# Patient Record
Sex: Female | Born: 1940 | Race: White | Hispanic: No | Smoking: Never smoker
Health system: Southern US, Community
[De-identification: ages and names within clinical notes are randomized; demographics above are authoritative.]

## PROBLEM LIST (undated history)

## (undated) DIAGNOSIS — K648 Other hemorrhoids: Secondary | ICD-10-CM

## (undated) DIAGNOSIS — E785 Hyperlipidemia, unspecified: Secondary | ICD-10-CM

## (undated) DIAGNOSIS — K219 Gastro-esophageal reflux disease without esophagitis: Secondary | ICD-10-CM

## (undated) DIAGNOSIS — I4891 Unspecified atrial fibrillation: Secondary | ICD-10-CM

## (undated) DIAGNOSIS — D649 Anemia, unspecified: Secondary | ICD-10-CM

## (undated) DIAGNOSIS — J45909 Unspecified asthma, uncomplicated: Secondary | ICD-10-CM

## (undated) DIAGNOSIS — H269 Unspecified cataract: Secondary | ICD-10-CM

## (undated) DIAGNOSIS — M8580 Other specified disorders of bone density and structure, unspecified site: Secondary | ICD-10-CM

## (undated) DIAGNOSIS — N842 Polyp of vagina: Secondary | ICD-10-CM

## (undated) DIAGNOSIS — K5790 Diverticulosis of intestine, part unspecified, without perforation or abscess without bleeding: Secondary | ICD-10-CM

## (undated) DIAGNOSIS — E559 Vitamin D deficiency, unspecified: Secondary | ICD-10-CM

## (undated) DIAGNOSIS — E039 Hypothyroidism, unspecified: Secondary | ICD-10-CM

## (undated) HISTORY — DX: Anemia, unspecified: D64.9

## (undated) HISTORY — DX: Unspecified atrial fibrillation: I48.91

## (undated) HISTORY — DX: Vitamin D deficiency, unspecified: E55.9

## (undated) HISTORY — DX: Other specified disorders of bone density and structure, unspecified site: M85.80

## (undated) HISTORY — DX: Hypothyroidism, unspecified: E03.9

## (undated) HISTORY — DX: Hyperlipidemia, unspecified: E78.5

## (undated) HISTORY — DX: Unspecified cataract: H26.9

## (undated) HISTORY — DX: Unspecified asthma, uncomplicated: J45.909

## (undated) HISTORY — PX: CATARACT EXTRACTION, BILATERAL: SHX1313

## (undated) HISTORY — DX: Other hemorrhoids: K64.8

## (undated) HISTORY — PX: BREAST EXCISIONAL BIOPSY: SUR124

## (undated) HISTORY — PX: TUBAL LIGATION: SHX77

## (undated) HISTORY — PX: OTHER SURGICAL HISTORY: SHX169

## (undated) HISTORY — DX: Polyp of vagina: N84.2

## (undated) HISTORY — PX: TONSILLECTOMY AND ADENOIDECTOMY: SUR1326

## (undated) HISTORY — DX: Gastro-esophageal reflux disease without esophagitis: K21.9

## (undated) HISTORY — DX: Diverticulosis of intestine, part unspecified, without perforation or abscess without bleeding: K57.90

---

## 1977-05-05 HISTORY — PX: LAPAROSCOPY: SHX197

## 1980-03-20 HISTORY — PX: DILATION AND CURETTAGE OF UTERUS: SHX78

## 1981-05-28 HISTORY — PX: ABDOMINAL HYSTERECTOMY: SHX81

## 2000-03-06 ENCOUNTER — Other Ambulatory Visit: Admission: RE | Admit: 2000-03-06 | Discharge: 2000-03-06 | Payer: Self-pay

## 2003-01-23 HISTORY — PX: KNEE ARTHROSCOPY: SUR90

## 2004-12-26 ENCOUNTER — Ambulatory Visit (HOSPITAL_COMMUNITY): Admission: RE | Admit: 2004-12-26 | Discharge: 2004-12-26 | Payer: Self-pay | Admitting: Sports Medicine

## 2005-12-04 ENCOUNTER — Ambulatory Visit (HOSPITAL_BASED_OUTPATIENT_CLINIC_OR_DEPARTMENT_OTHER): Admission: RE | Admit: 2005-12-04 | Discharge: 2005-12-04 | Payer: Self-pay | Admitting: Surgery

## 2005-12-04 ENCOUNTER — Encounter (INDEPENDENT_AMBULATORY_CARE_PROVIDER_SITE_OTHER): Payer: Self-pay | Admitting: *Deleted

## 2008-03-16 ENCOUNTER — Telehealth: Payer: Self-pay | Admitting: Internal Medicine

## 2008-03-16 ENCOUNTER — Encounter: Payer: Self-pay | Admitting: Internal Medicine

## 2008-03-17 DIAGNOSIS — K5730 Diverticulosis of large intestine without perforation or abscess without bleeding: Secondary | ICD-10-CM | POA: Insufficient documentation

## 2008-03-17 DIAGNOSIS — R141 Gas pain: Secondary | ICD-10-CM

## 2008-03-17 DIAGNOSIS — K625 Hemorrhage of anus and rectum: Secondary | ICD-10-CM

## 2008-03-17 DIAGNOSIS — R143 Flatulence: Secondary | ICD-10-CM

## 2008-03-17 DIAGNOSIS — R142 Eructation: Secondary | ICD-10-CM

## 2008-03-17 DIAGNOSIS — R12 Heartburn: Secondary | ICD-10-CM

## 2008-03-20 ENCOUNTER — Telehealth: Payer: Self-pay | Admitting: Internal Medicine

## 2008-03-20 ENCOUNTER — Ambulatory Visit: Payer: Self-pay | Admitting: Internal Medicine

## 2008-04-03 ENCOUNTER — Telehealth: Payer: Self-pay | Admitting: Internal Medicine

## 2010-09-20 NOTE — Op Note (Signed)
NAMESHALLYN, CONSTANCIO                ACCOUNT NO.:  [id number]   MEDICAL RECORD NO.:  [id number]          PATIENT TYPE:  AMB   LOCATION:  DSC                          FACILITY:  [facility]   PHYSICIAN:  Thomas A. Cornett, M.D.DATE OF BIRTH:  [birth year]   DATE OF PROCEDURE:  DATE OF DISCHARGE:                                 OPERATIVE REPORT   PREOPERATIVE DIAGNOSIS:  Left breast mass x2.   POSTOPERATIVE DIAGNOSIS:  Left breast mass x2.   PROCEDURE:  Open left breast biopsy x2.   SURGEON:  Dr. Harriette Bouillon.   ANESTHESIA:  MAC with 30 mL of 0.25% Marcaine with epinephrine.   ESTIMATED BLOOD LOSS:  5 mL.   SPECIMENS:  1.  Medial left breast mass to Pathology.  2.  Lateral left breast mass to Pathology.   INDICATIONS FOR PROCEDURE:  The patient is a 70 year old female who has had  a normal mammogram but continues to have some nodularity in2 prominent areas  in her left breast that her bothering her.  These were full and she did not  want to observe these areas and wished to have them removed for further  diagnosis.  After discussion of options of observation with close followup  versus open biopsy versus core biopsy, she elected to undergo core biopsy  since this would not be something very easy to biopsy percutaneously and  there no abnormalities on mammogram or ultrasound to help [place].  She  understood the above and agreed to proceed.   DESCRIPTION OF PROCEDURE:  The patient was brought to the operating room and  placed supine.  After induction of MAC anesthesia, the left breast was  prepped and draped in sterile fashion.  The 2 areas of concern were in the  left breast at about the 9 and 10 o'clock position about 2  fingerbreadths  from the edge of the areola and the second area was lateral at about 2  o'clock and it was about 2 fingerbreadths from the areola.  The superior  portion of her nipple was anesthetized after sterile prep and drape with  local anesthesia.  I made one  curvilinear incision since both these areas  could be reached through the one incision on the superior aspect of the  junction of her nipple and skin.  I was able to palpate the medial mass,  dissect around it and pass it off the field.  There was about a centimeter  of tissue or so with some fibrous changes to it.  The second area was  identified in the left lateral breast and again I was able to palpate it,  grab it with an Allis clamp and dissect around it and remove it.  Again  there was some fibrous fatty tissue with it as well.  I did not see any hard  mass though.  Hemostasis was excellent.  The wound was irrigated  and then closed in layers with 3-0 Vicryl and 4-0 Monocryl subcuticular  stitch.  Steri-Strips and dry dressings were applied.  All final counts of  sponges, needles and instruments were found be correct  for this portion of  the case.  The patient was awakened, taken to recovery in satisfactory  condition.      Thomas A. Cornett, M.D.  Electronically Signed     TAC/MEDQ  D:  12/04/2005  T:  12/04/2005  Job:  [id number]   cc:   [facility] PROGRAM

## 2011-04-11 ENCOUNTER — Other Ambulatory Visit: Payer: Self-pay | Admitting: Family Medicine

## 2011-04-11 ENCOUNTER — Other Ambulatory Visit (HOSPITAL_COMMUNITY)
Admission: RE | Admit: 2011-04-11 | Discharge: 2011-04-11 | Disposition: A | Discharge status: Home / Self Care | Payer: Medicare Other | Source: Ambulatory Visit | Attending: Family Medicine | Admitting: Family Medicine

## 2011-04-11 DIAGNOSIS — Z124 Encounter for screening for malignant neoplasm of cervix: Secondary | ICD-10-CM | POA: Insufficient documentation

## 2011-10-09 ENCOUNTER — Other Ambulatory Visit: Payer: Self-pay | Admitting: Dermatology

## 2012-07-14 ENCOUNTER — Encounter: Payer: Self-pay | Admitting: Internal Medicine

## 2012-11-01 ENCOUNTER — Encounter: Payer: Self-pay | Admitting: *Deleted

## 2012-12-21 ENCOUNTER — Ambulatory Visit: Payer: Medicare Other | Admitting: Internal Medicine

## 2013-02-08 ENCOUNTER — Telehealth: Payer: Self-pay | Admitting: *Deleted

## 2013-02-08 ENCOUNTER — Encounter (HOSPITAL_COMMUNITY): Payer: Self-pay | Admitting: Emergency Medicine

## 2013-02-08 ENCOUNTER — Inpatient Hospital Stay (HOSPITAL_COMMUNITY)
Admission: EM | Admit: 2013-02-08 | Discharge: 2013-02-10 | DRG: 310 | Disposition: A | Discharge status: Home / Self Care | Payer: Medicare Other | Attending: Cardiology | Admitting: Cardiology

## 2013-02-08 ENCOUNTER — Emergency Department (HOSPITAL_COMMUNITY): Payer: Medicare Other

## 2013-02-08 ENCOUNTER — Encounter: Payer: Self-pay | Admitting: Internal Medicine

## 2013-02-08 ENCOUNTER — Ambulatory Visit (INDEPENDENT_AMBULATORY_CARE_PROVIDER_SITE_OTHER): Payer: Medicare Other | Admitting: Internal Medicine

## 2013-02-08 VITALS — BP 134/84 | HR 118 | Ht 62.21 in | Wt 198.0 lb

## 2013-02-08 DIAGNOSIS — J45909 Unspecified asthma, uncomplicated: Secondary | ICD-10-CM | POA: Diagnosis present

## 2013-02-08 DIAGNOSIS — K3189 Other diseases of stomach and duodenum: Secondary | ICD-10-CM

## 2013-02-08 DIAGNOSIS — K625 Hemorrhage of anus and rectum: Secondary | ICD-10-CM

## 2013-02-08 DIAGNOSIS — K219 Gastro-esophageal reflux disease without esophagitis: Secondary | ICD-10-CM | POA: Diagnosis present

## 2013-02-08 DIAGNOSIS — D649 Anemia, unspecified: Secondary | ICD-10-CM | POA: Diagnosis present

## 2013-02-08 DIAGNOSIS — K5730 Diverticulosis of large intestine without perforation or abscess without bleeding: Secondary | ICD-10-CM | POA: Diagnosis present

## 2013-02-08 DIAGNOSIS — I4891 Unspecified atrial fibrillation: Principal | ICD-10-CM | POA: Diagnosis present

## 2013-02-08 DIAGNOSIS — E785 Hyperlipidemia, unspecified: Secondary | ICD-10-CM | POA: Diagnosis present

## 2013-02-08 DIAGNOSIS — E039 Hypothyroidism, unspecified: Secondary | ICD-10-CM | POA: Diagnosis present

## 2013-02-08 DIAGNOSIS — K648 Other hemorrhoids: Secondary | ICD-10-CM

## 2013-02-08 LAB — COMPREHENSIVE METABOLIC PANEL
ALT: 56 U/L — ABNORMAL HIGH (ref 0–35)
AST: 63 U/L — ABNORMAL HIGH (ref 0–37)
Albumin: 4.3 g/dL (ref 3.5–5.2)
BUN: 8 mg/dL (ref 6–23)
Calcium: 10 mg/dL (ref 8.4–10.5)
Chloride: 99 mEq/L (ref 96–112)
Creatinine, Ser: 0.64 mg/dL (ref 0.50–1.10)
Potassium: 3.9 mEq/L (ref 3.5–5.1)
Total Protein: 7.7 g/dL (ref 6.0–8.3)

## 2013-02-08 LAB — CBC WITH DIFFERENTIAL/PLATELET
Basophils Absolute: 0 10*3/uL (ref 0.0–0.1)
Basophils Relative: 0 % (ref 0–1)
Eosinophils Absolute: 0.1 10*3/uL (ref 0.0–0.7)
Eosinophils Relative: 1 % (ref 0–5)
HCT: 44.3 % (ref 36.0–46.0)
Lymphocytes Relative: 48 % — ABNORMAL HIGH (ref 12–46)
MCH: 33.4 pg (ref 26.0–34.0)
MCV: 93.1 fL (ref 78.0–100.0)
Monocytes Absolute: 0.6 10*3/uL (ref 0.1–1.0)
Neutro Abs: 3.5 10*3/uL (ref 1.7–7.7)
Neutrophils Relative %: 44 % (ref 43–77)
Platelets: 316 10*3/uL (ref 150–400)
RBC: 4.76 MIL/uL (ref 3.87–5.11)
RDW: 12.2 % (ref 11.5–15.5)

## 2013-02-08 LAB — POCT I-STAT TROPONIN I: Troponin i, poc: 0 ng/mL (ref 0.00–0.08)

## 2013-02-08 MED ORDER — DILTIAZEM LOAD VIA INFUSION
20.0000 mg | Freq: Once | INTRAVENOUS | Status: AC
  Administered 2013-02-08: 20 mg via INTRAVENOUS
  Filled 2013-02-08: qty 20

## 2013-02-08 MED ORDER — DILTIAZEM HCL 100 MG IV SOLR
5.0000 mg/h | INTRAVENOUS | Status: DC
  Administered 2013-02-08: 5 mg/h via INTRAVENOUS

## 2013-02-08 MED ORDER — ADENOSINE 6 MG/2ML IV SOLN
INTRAVENOUS | Status: AC
  Filled 2013-02-08: qty 10

## 2013-02-08 MED ORDER — HYDROCORTISONE ACETATE 25 MG RE SUPP: 25.0000 mg | Freq: Every day | RECTAL | Status: DC

## 2013-02-08 MED ORDER — OMEPRAZOLE 20 MG PO CPDR: 20.0000 mg | DELAYED_RELEASE_CAPSULE | Freq: Every day | ORAL | Status: DC

## 2013-02-08 MED ORDER — MOVIPREP 100 G PO SOLR: 1.0000 | Freq: Once | ORAL | Status: DC

## 2013-02-08 NOTE — ED Notes (Signed)
Pt HR between 106-125. Cardizem increased to 10 mg/hr. MD at bedside.

## 2013-02-08 NOTE — ED Notes (Addendum)
[name], daughter [phone] cell phone in case of emergency

## 2013-02-08 NOTE — ED Provider Notes (Signed)
CSN: [id number]     Arrival date & time 02/08/13  2046 History   First MD Initiated Contact with Patient 02/08/13 2103     Chief Complaint  Patient presents with  . Palpitations   (Consider location/radiation/quality/duration/timing/severity/associated sxs/prior Treatment) Patient is a 72 y.o. female presenting with palpitations. The history is provided by the patient. No language interpreter was used.  Palpitations Palpitations quality:  Irregular Onset quality:  Sudden Duration:  5 hours Timing:  Constant Progression:  Unchanged Chronicity:  New Context: caffeine (one cup of black coffe today)   Relieved by:  Nothing Worsened by:  Caffeine Associated symptoms: malaise/fatigue   Associated symptoms: no back pain, no chest pain, no chest pressure, no dizziness, no lower extremity edema, no near-syncope and no shortness of breath   Risk factors: hx of thyroid disease   Risk factors: no hx of atrial fibrillation     Past Medical History  Diagnosis Date  . Diverticulosis   . Internal hemorrhoids   . Anemia   . Asthma   . HLD (hyperlipidemia)   . Hypothyroidism   . GERD (gastroesophageal reflux disease)    Past Surgical History  Procedure Laterality Date  . Cesarean section  02/07/70  . Abdominal hysterectomy  05/28/81    oavarian cyst and appendix removed  . Tubal ligation    . Tonsillectomy and adenoidectomy    . Breast lumpectomy Left   . Birth mark surgery  2/81, 1976  . Cataract extraction, bilateral    . Knee arthroscopy Left 01/23/03  . Laparoscopy  1979  . Dilation and curettage of uterus  03/20/80   Family History  Problem Relation Age of Onset  . Skin cancer Father   . Heart disease Mother   . Colon cancer Neg Hx    History  Substance Use Topics  . Smoking status: Never Smoker   . Smokeless tobacco: Never Used  . Alcohol Use: Yes   OB History   Grav Para Term Preterm Abortions TAB SAB Ect Mult Living                 Review of Systems   Constitutional: Positive for malaise/fatigue and fatigue. Negative for fever and activity change.  Respiratory: Negative for chest tightness and shortness of breath.   Cardiovascular: Positive for palpitations. Negative for chest pain, leg swelling and near-syncope.  Musculoskeletal: Negative for back pain.  Neurological: Negative for dizziness, tremors, syncope, weakness and light-headedness.  All other systems reviewed and are negative.    Allergies  Sulfonamide derivatives  Home Medications   Current Outpatient Rx  Name  Route  Sig  Dispense  Refill  . B Complex-C-Folic Acid (STRESS B COMPLEX PO)   Oral   Take 1 tablet by mouth daily.         . Cholecalciferol (VITAMIN D) 1000 UNITS capsule   Oral   Take 2,000 Units by mouth daily.         . hydrocortisone (ANUSOL-HC) 25 MG suppository   Rectal   Place 1 suppository (25 mg total) rectally at bedtime.   12 suppository   0   . levothyroxine (SYNTHROID, LEVOTHROID) 50 MCG tablet   Oral   Take 50 mcg by mouth daily before breakfast.         . MOVIPREP 100 G SOLR   Oral   Take 1 kit (200 g total) by mouth once.   1 kit   0     Dispense as written.   Marland Kitchen  omeprazole (PRILOSEC) 20 MG capsule   Oral   Take 1 capsule (20 mg total) by mouth daily.   30 capsule   1   . vitamin C (ASCORBIC ACID) 500 MG tablet   Oral   Take 500 mg by mouth daily.          BP 148/119  Pulse 160  Temp(Src) 98.1 F (36.7 C) (Oral)  Resp 20  SpO2 100% Physical Exam  Vitals reviewed. Constitutional: She is oriented to person, place, and time. She appears well-developed and well-nourished. No distress.  HENT:  Head: Normocephalic.  Eyes: Conjunctivae are normal.  Neck: Neck supple. No JVD present. No tracheal deviation present.  Cardiovascular: Normal heart sounds.  An irregularly irregular rhythm present. Tachycardia present.  Exam reveals decreased pulses (radial).   Pulmonary/Chest: Effort normal and breath sounds normal.   Abdominal: Soft. Bowel sounds are normal. She exhibits no distension. There is no tenderness.  Musculoskeletal: She exhibits no edema.  Neurological: She is alert and oriented to person, place, and time.  Skin: Skin is warm and dry.    ED Course  Procedures (including critical care time) Labs Review Labs Reviewed  CBC WITH DIFFERENTIAL - Abnormal; Notable for the following:    Hemoglobin 15.9 (*)    Lymphocytes Relative 48 (*)    All other components within normal limits  COMPREHENSIVE METABOLIC PANEL - Abnormal; Notable for the following:    Glucose, Bld 106 (*)    AST 63 (*)    ALT 56 (*)    GFR calc non Af Amer 88 (*)    All other components within normal limits  POCT I-STAT TROPONIN I   Imaging Review Dg Chest Portable 1 View  02/08/2013   CLINICAL DATA:  Chest pressure.  EXAM: PORTABLE CHEST - 1 VIEW  COMPARISON:  None.  FINDINGS: Moderate cardiomegaly noted with indistinct pulmonary vasculature but without overt edema. No significant airway thickening.  Mild levoconvex thoracic scoliosis. The density projecting over the left upper lung and scapula noted.  IMPRESSION: 1. Cardiomegaly with a suggestion of pulmonary venous hypertension but without overt edema. 2. New equivocal vague density projecting over the left upper lung and scapula, probably artifact or skin fold. Followup chest radiography in 4-6 weeks time is recommended to ensure clearance and exclude the unlikely possibility of a pulmonary nodule. If the patient has a history of malignancy then chest CT might be utilized for definitive assessment.   Electronically Signed   By: Herbie Baltimore M.D.   On: 02/08/2013 21:33    Date: 02/08/2013  Rate: 157  Rhythm: atrial fibrillation  QRS Axis: normal  Intervals: QT normal  ST/T Wave abnormalities: nonspecific ST changes  Conduction Disutrbances:none  Narrative Interpretation: atrial fibrillation with rapid ventricular rate  Old EKG Reviewed: no prior for  comparison   MDM   1. Atrial fibrillation with rapid ventricular response     72 y/o female with history of hypothyroidism presenting with palpitations. EKG at PCP showing atrial fibrillation. No prior cardiac history or history of atrial fibrillation. Reports feeling fatigued for an indeterminant time but states that this has not changed her daily activities. It is unclear how long she has been in atrial fibrillation. She denies chest pain, SOB, dizziness, syncope.   Diltiazem bolus and infusion began. Rate controlled to 100s without conversion to sinus rhythm. CXR with cardiomegaly but no acute cardiopulmonary process. Troponin negative. Patient stable while in ED.  Plan for admission to Cardiology.   Labs and imaging  reviewed in my medical decision making if ordered. Patient discussed with my attending, Dr. Vaughan Basta, MD 02/09/13 [phone]

## 2013-02-08 NOTE — ED Provider Notes (Signed)
72 year old female presents with a complaint of generalized weakness and found to be in atrial fibrillation while she was visiting the gastroenterologist today. She was released from the office to come here to the hospital on her own accord, she stopped by home first and waited for family members but on arrival showed atrial fibrillation with a heart rate of approximately 160 beats per minute, this is irregularly irregular, she has weak pulses at the radial arteries but is mentating normally, has good color, good capillary refill and no peripheral edema. Her lungs are clear without rales wheezing or rhonchi, her heart sounds are irregular but no obvious murmurs are auscultated. I do not see any obvious JVD. The patient's diagnosis is a atrial fibrillation  with rapid ventricular response.  The patient remained in atrial fibrillation but required Cardizem bolus as well as a Cardizem constant drip to maintain a normal heart rate. Her heart rate finally came down to close to 100 but remained in atrial fibrillation. Due to the ongoing nature of the patient's cardiac dysfunction and arrhythmia, titratable Cardizem continue to be used as a drip, cardiology was consulted and admitted to the hospital, the patient was critically ill with this cardiac arrhythmia.  CRITICAL CARE Performed by: Vida Roller Total critical care time: 35 Critical care time was exclusive of separately billable procedures and treating other patients. Critical care was necessary to treat or prevent imminent or life-threatening deterioration. Critical care was time spent personally by me on the following activities: development of treatment plan with patient and/or surrogate as well as nursing, discussions with consultants, evaluation of patient's response to treatment, examination of patient, obtaining history from patient or surrogate, ordering and performing treatments and interventions, ordering and review of laboratory studies,  ordering and review of radiographic studies, pulse oximetry and re-evaluation of patient's condition.  She does have a history of thyroid dysfunction and is on levothyroxine, drinks coffee but is not aware of any underlying cardiac history.  Results for orders placed during the hospital encounter of 02/08/13  CBC WITH DIFFERENTIAL      Result Value Range   WBC 8.0  4.0 - 10.5 K/uL   RBC 4.76  3.87 - 5.11 MIL/uL   Hemoglobin 15.9 (*) 12.0 - 15.0 g/dL   HCT 16.1  09.6 - 04.5 %   MCV 93.1  78.0 - 100.0 fL   MCH 33.4  26.0 - 34.0 pg   MCHC 35.9  30.0 - 36.0 g/dL   RDW 40.9  81.1 - 91.4 %   Platelets 316  150 - 400 K/uL   Neutrophils Relative % 44  43 - 77 %   Neutro Abs 3.5  1.7 - 7.7 K/uL   Lymphocytes Relative 48 (*) 12 - 46 %   Lymphs Abs 3.8  0.7 - 4.0 K/uL   Monocytes Relative 7  3 - 12 %   Monocytes Absolute 0.6  0.1 - 1.0 K/uL   Eosinophils Relative 1  0 - 5 %   Eosinophils Absolute 0.1  0.0 - 0.7 K/uL   Basophils Relative 0  0 - 1 %   Basophils Absolute 0.0  0.0 - 0.1 K/uL  COMPREHENSIVE METABOLIC PANEL      Result Value Range   Sodium 135  135 - 145 mEq/L   Potassium 3.9  3.5 - 5.1 mEq/L   Chloride 99  96 - 112 mEq/L   CO2 23  19 - 32 mEq/L   Glucose, Bld 106 (*) 70 - 99 mg/dL  BUN 8  6 - 23 mg/dL   Creatinine, Ser 1.61  0.50 - 1.10 mg/dL   Calcium 09.6  8.4 - 04.5 mg/dL   Total Protein 7.7  6.0 - 8.3 g/dL   Albumin 4.3  3.5 - 5.2 g/dL   AST 63 (*) 0 - 37 U/L   ALT 56 (*) 0 - 35 U/L   Alkaline Phosphatase 78  39 - 117 U/L   Total Bilirubin 0.5  0.3 - 1.2 mg/dL   GFR calc non Af Amer 88 (*) >90 mL/min   GFR calc Af Amer >90  >90 mL/min  POCT I-STAT TROPONIN I      Result Value Range   Troponin i, poc 0.00  0.00 - 0.08 ng/mL   Comment 3            Dg Chest Portable 1 View  02/08/2013   CLINICAL DATA:  Chest pressure.  EXAM: PORTABLE CHEST - 1 VIEW  COMPARISON:  None.  FINDINGS: Moderate cardiomegaly noted with indistinct pulmonary vasculature but without overt  edema. No significant airway thickening.  Mild levoconvex thoracic scoliosis. The density projecting over the left upper lung and scapula noted.  IMPRESSION: 1. Cardiomegaly with a suggestion of pulmonary venous hypertension but without overt edema. 2. New equivocal vague density projecting over the left upper lung and scapula, probably artifact or skin fold. Followup chest radiography in 4-6 weeks time is recommended to ensure clearance and exclude the unlikely possibility of a pulmonary nodule. If the patient has a history of malignancy then chest CT might be utilized for definitive assessment.   Electronically Signed   By: Herbie Baltimore M.D.   On: 02/08/2013 21:33   Diagnosis  #1 New onset atrial fibrillation with rapid ventricular rate   I personally interpreted the EKG as well as the resident and agree with the interpretation on the resident's chart.  I saw and evaluated the patient, reviewed the resident's note and I agree with the findings and plan.     Vida Roller, MD 02/08/13 [phone]

## 2013-02-08 NOTE — Telephone Encounter (Signed)
Per Dr Regino Schultze request, I have contacted Dr Zachery Dauer office to advise that patient was in A Fib today at her office visit with [place] and to advise that we asked patient to go to the ER for evaluation at the recommendation of Dr Olga Millers in cardiology. Patient, to our knowledge, has not gone to the ER as per our recommendations at this time. We have also sent our office note to Dr Zachery Dauer for her records in case patient contacts their office.

## 2013-02-08 NOTE — ED Notes (Signed)
Patient requested and received Bed Side Commode. Assisted patient up and out of bed to the Commode.

## 2013-02-08 NOTE — Progress Notes (Addendum)
Janice David 05-24-1940 MRN [id number]   History of Present Illness:  This is a 72 year old white female who comes today because she is due for a recall colonoscopy. Her last exam in March 2004 showed mild diverticulosis and internal hemorrhoids.She is here today because of dyspepsia and bloating. She has food intolerance and pain in the epigastrium and left shoulder which bothers her while eating. She denies dysphagia or odynophagia. She has gained weight. She is also complaining of a prolapsing hemorrhoid which has been noted on a prior exam when I saw her in 2009. She, at that time was going to have a colonoscopy but because of her mother's illness, was unable to schedule it.   Past Medical History  Diagnosis Date  . Diverticulosis   . Internal hemorrhoids   . Anemia   . Asthma   . HLD (hyperlipidemia)   . Hypothyroidism   . GERD (gastroesophageal reflux disease)    Past Surgical History  Procedure Laterality Date  . Cesarean section  02/07/70  . Abdominal hysterectomy  05/28/81    oavarian cyst and appendix removed  . Tubal ligation    . Tonsillectomy and adenoidectomy    . Breast lumpectomy Left   . Birth mark surgery  2/81, 1976  . Cataract extraction, bilateral    . Knee arthroscopy Left 01/23/03  . Laparoscopy  1979  . Dilation and curettage of uterus  03/20/80    reports that she has never smoked. She has never used smokeless tobacco. She reports that  drinks alcohol. She reports that she does not use illicit drugs. family history includes Heart disease in her mother; Skin cancer in her father. There is no history of Colon cancer. Allergies  Allergen Reactions  . Sulfonamide Derivatives         Review of Systems: Symptomatic hemorrhoids. Bloating epigastric and left shoulder discomfort. Dyspepsia  The remainder of the 10 point ROS is negative except as outlined in H&P   Physical Exam: General appearance  Well developed, in no distress. Overweight Eyes- non  icteric. HEENT nontraumatic, normocephalic. Mouth no lesions, tongue papillated, no cheilosis. Neck supple without adenopathy, thyroid not enlarged, no carotid bruits, no JVD. Lungs Clear to auscultation bilaterally. Cor normal S1, normal S2, irregular rapid rhythm, no murmur, quiet precordium. Faint radial pulse. Abdomen: Protuberant, obese, soft with normoactive bowel sounds. Minimal tenderness in the left costal margin. Rectal: And anoscopic exam reveals her to have internal hemorrhoids without prolapse. No thrombosis. Stool is Hemoccult negative. There are no external hemorrhoids. Extremities no pedal edema. Skin no lesions. Neurological alert and oriented x 3. Psychological normal mood and affect.  Assessment and Plan:  Problem #10 72 year old white female who is due for a recall colonoscopy. We have discussed possible hemorrhoidal banding. This could be done after her colonoscopy. We will start her on Anusol-HC suppositories. I suggested Dr Leone Payor doing the office procedure after she completes her colonoscopy.  Problem #2 Dyspepsia and bloating. An upper endoscopy will be scheduled. We will also do an upper abdominal ultrasound and start her on omeprazole 20 mg daily.  Problem #3 Irregular heartbeat. It sounds like atrial fibrillation. I counted a pulse of 120. We ordered an EKG and it has been reviewed. Review shows atrial flutter and atrial fibrillation at 146 beats/min. We have called Dr Jens Som who recommended for the patient to go to the Emergency Department. We have discussed it with the patient and have given her a copy of her EKG tracing to take to  the emergency room.   02/08/2013 Janice David

## 2013-02-08 NOTE — Patient Instructions (Addendum)
Please go to [facility] Emergency Room after leaving today for evaluation of your atrial fibrillation. Per Dr Jens Som, you should be evaluated at the hospital due to your elevated heart rate.  We will call you back about appointments for endoscopy/colonoscopy and ultrasound once you have been evaluated for the atrial fibrillation.  We have sent the following medications to your pharmacy for you to pick up at your convenience: Omeprazole Anusol Suppositories  CC: Dr Juluis Rainier

## 2013-02-08 NOTE — ED Notes (Signed)
Pt. reports intermittent palpitations onset today , pt. is in atrial fibrillation at triage , denies chest pain , respirations unlabored. Marland Kitchen

## 2013-02-09 ENCOUNTER — Telehealth: Payer: Self-pay | Admitting: *Deleted

## 2013-02-09 ENCOUNTER — Inpatient Hospital Stay (HOSPITAL_COMMUNITY): Payer: Medicare Other

## 2013-02-09 DIAGNOSIS — I4891 Unspecified atrial fibrillation: Principal | ICD-10-CM

## 2013-02-09 DIAGNOSIS — E785 Hyperlipidemia, unspecified: Secondary | ICD-10-CM | POA: Diagnosis present

## 2013-02-09 DIAGNOSIS — E039 Hypothyroidism, unspecified: Secondary | ICD-10-CM | POA: Diagnosis present

## 2013-02-09 DIAGNOSIS — K219 Gastro-esophageal reflux disease without esophagitis: Secondary | ICD-10-CM | POA: Diagnosis present

## 2013-02-09 LAB — HEMOGLOBIN A1C
Hgb A1c MFr Bld: 6 % — ABNORMAL HIGH (ref <5.7)
Mean Plasma Glucose: 126 mg/dL — ABNORMAL HIGH (ref <117)

## 2013-02-09 LAB — CBC WITH DIFFERENTIAL/PLATELET
Basophils Absolute: 0 10*3/uL (ref 0.0–0.1)
Eosinophils Relative: 1 % (ref 0–5)
Hemoglobin: 14.1 g/dL (ref 12.0–15.0)
Lymphocytes Relative: 46 % (ref 12–46)
Lymphs Abs: 3.4 10*3/uL (ref 0.7–4.0)
MCV: 94.2 fL (ref 78.0–100.0)
Monocytes Relative: 6 % (ref 3–12)
Neutro Abs: 3.4 10*3/uL (ref 1.7–7.7)
Neutrophils Relative %: 47 % (ref 43–77)
Platelets: 285 10*3/uL (ref 150–400)
RBC: 4.32 MIL/uL (ref 3.87–5.11)
RDW: 12.5 % (ref 11.5–15.5)
WBC: 7.4 10*3/uL (ref 4.0–10.5)

## 2013-02-09 LAB — BASIC METABOLIC PANEL
BUN: 6 mg/dL (ref 6–23)
CO2: 26 mEq/L (ref 19–32)
Chloride: 107 mEq/L (ref 96–112)
Creatinine, Ser: 0.6 mg/dL (ref 0.50–1.10)
GFR calc non Af Amer: 90 mL/min — ABNORMAL LOW (ref >90)
Glucose, Bld: 109 mg/dL — ABNORMAL HIGH (ref 70–99)
Potassium: 3.9 mEq/L (ref 3.5–5.1)

## 2013-02-09 LAB — PROTIME-INR
INR: 1.1 (ref 0.00–1.49)
Prothrombin Time: 14 seconds (ref 11.6–15.2)

## 2013-02-09 LAB — HEPARIN LEVEL (UNFRACTIONATED): Heparin Unfractionated: 0.7 IU/mL (ref 0.30–0.70)

## 2013-02-09 LAB — TSH: TSH: 4.157 u[IU]/mL (ref 0.350–4.500)

## 2013-02-09 LAB — TROPONIN I: Troponin I: <0.3 ng/mL (ref <0.30)

## 2013-02-09 MED ORDER — RIVAROXABAN 20 MG PO TABS
20.0000 mg | ORAL_TABLET | Freq: Every day | ORAL | Status: DC
  Filled 2013-02-09: qty 1

## 2013-02-09 MED ORDER — METOPROLOL TARTRATE 1 MG/ML IV SOLN: 5.0000 mg | Freq: Four times a day (QID) | INTRAVENOUS | Status: DC | PRN

## 2013-02-09 MED ORDER — DILTIAZEM HCL 60 MG PO TABS
60.0000 mg | ORAL_TABLET | Freq: Four times a day (QID) | ORAL | Status: DC
  Administered 2013-02-09 – 2013-02-10 (×3): 60 mg via ORAL
  Filled 2013-02-09 (×8): qty 1

## 2013-02-09 MED ORDER — HEPARIN (PORCINE) IN NACL 100-0.45 UNIT/ML-% IJ SOLN
1200.0000 [IU]/h | INTRAMUSCULAR | Status: AC
  Administered 2013-02-09: 1200 [IU]/h via INTRAVENOUS
  Filled 2013-02-09: qty 250

## 2013-02-09 MED ORDER — HEPARIN BOLUS VIA INFUSION
3000.0000 [IU] | Freq: Once | INTRAVENOUS | Status: AC
  Administered 2013-02-09: 3000 [IU] via INTRAVENOUS
  Filled 2013-02-09: qty 3000

## 2013-02-09 MED ORDER — SODIUM CHLORIDE 0.9 % IJ SOLN
3.0000 mL | Freq: Two times a day (BID) | INTRAMUSCULAR | Status: DC
  Administered 2013-02-09 – 2013-02-10 (×2): 3 mL via INTRAVENOUS

## 2013-02-09 MED ORDER — SODIUM CHLORIDE 0.9 % IV SOLN: 250.0000 mL | INTRAVENOUS | Status: DC | PRN

## 2013-02-09 MED ORDER — ACETAMINOPHEN 325 MG PO TABS: 650.0000 mg | ORAL_TABLET | ORAL | Status: DC | PRN

## 2013-02-09 MED ORDER — RIVAROXABAN 20 MG PO TABS: 20.0000 mg | ORAL_TABLET | Freq: Every day | ORAL | Status: DC

## 2013-02-09 MED ORDER — RIVAROXABAN 20 MG PO TABS
20.0000 mg | ORAL_TABLET | Freq: Every day | ORAL | Status: DC
  Administered 2013-02-09: 20 mg via ORAL
  Filled 2013-02-09 (×2): qty 1

## 2013-02-09 MED ORDER — METOPROLOL TARTRATE 25 MG PO TABS
25.0000 mg | ORAL_TABLET | Freq: Two times a day (BID) | ORAL | Status: DC
  Administered 2013-02-09 (×2): 25 mg via ORAL
  Filled 2013-02-09 (×4): qty 1

## 2013-02-09 MED ORDER — ONDANSETRON HCL 4 MG/2ML IJ SOLN: 4.0000 mg | Freq: Four times a day (QID) | INTRAMUSCULAR | Status: DC | PRN

## 2013-02-09 MED ORDER — LEVOTHYROXINE SODIUM 50 MCG PO TABS
50.0000 ug | ORAL_TABLET | Freq: Every day | ORAL | Status: DC
  Administered 2013-02-09: 50 ug via ORAL
  Filled 2013-02-09 (×3): qty 1

## 2013-02-09 MED ORDER — SODIUM CHLORIDE 0.9 % IJ SOLN: 3.0000 mL | INTRAMUSCULAR | Status: DC | PRN

## 2013-02-09 NOTE — H&P (Signed)
History and Physical   Patient ID: Janice David MRN: [id number], DOB/AGE: 1940-05-09   Admit date: 02/08/2013 Date of Consult: 02/09/2013   Primary Physician: Gaye Alken, MD Primary Cardiologist: Anderson Malta, assigned to Dr. Wyline Mood (on-call)  HPI: Janice David is a 72 y.o. female with fairly limited PMHx of GERD, diverticulosis, internal hemorrhoids, HLD, hypothyroidism and she was seen today by her GI physician Dr. Juanda Chance for c/o dyspepsia+bloating and concern that she needed a repeat colonoscopy (her last exam in March 2004 showed mild diverticulosis and internal hemorrhoids).  Notably, she described food intolerance and pain in the epigastrium and left shoulder which bothers her while eating.  She denies dysphagia or odynophagia.  She has gained weight.  She is also complaining of a prolapsing hemorrhoid which has been noted on a prior exam when I saw her in 2009. She, at that time was going to have a colonoscopy but because of her mother's illness, was unable to schedule it.  Her GI physician noted an irregular heartbeat and performed and ECG on today's office visit which apparently showed Afib-Aflutter with RVR and Dr. Jens Som was called and gave advice for patient to go to the ED.  Patient felt "fine" and decided to go home to collect her things and have dinner and then presented to the [facility] ED.  She presented in AFib with RVR at 150-160 bpm and normotensive.  She was initated on a diltiazem infusion after a 20mg  IV Diltiazem bolus was given.  Labwork showed negative Troponin and stable electrolytes, renal function and CBC.  She denies any prior h/o arrhythmia/prior stress testing/cardiac cath/stents.  She denies any prior h/o CAD/MI.  She feels that she's had issues with "heart skipping a beat" for months as well as a subtle sense of "fluttering" in her heart over the last few months but she's unsure as to when the current palpitations truly began.  She denies  orthopnea/PND/edema.   Problem List: Past Medical History  Diagnosis Date  . Diverticulosis   . Internal hemorrhoids   . Anemia   . Asthma   . HLD (hyperlipidemia)   . Hypothyroidism   . GERD (gastroesophageal reflux disease)     Past Surgical History  Procedure Laterality Date  . Cesarean section  02/07/70  . Abdominal hysterectomy  05/28/81    oavarian cyst and appendix removed  . Tubal ligation    . Tonsillectomy and adenoidectomy    . Breast lumpectomy Left   . Birth mark surgery  2/81, 1976  . Cataract extraction, bilateral    . Knee arthroscopy Left 01/23/03  . Laparoscopy  1979  . Dilation and curettage of uterus  03/20/80     Allergies:  Allergies  Allergen Reactions  . Sulfonamide Derivatives     Home Medications: Prior to Admission medications   Medication Sig Start Date End Date Taking? Authorizing Provider  B Complex-C-Folic Acid (STRESS B COMPLEX PO) Take 1 tablet by mouth daily.   Yes Historical Provider, MD  Cholecalciferol (VITAMIN D) 1000 UNITS capsule Take 2,000 Units by mouth daily.   Yes Historical Provider, MD  levothyroxine (SYNTHROID, LEVOTHROID) 50 MCG tablet Take 50 mcg by mouth daily before breakfast.   Yes Historical Provider, MD  vitamin C (ASCORBIC ACID) 500 MG tablet Take 500 mg by mouth daily.   Yes Historical Provider, MD    Inpatient Medications:     (Not in a hospital admission)  Family History  Problem Relation Age of Onset  . Skin cancer  Father   . Heart disease Mother   . Colon cancer Neg Hx      History   Social History  . Marital Status: Divorced    Spouse Name: N/A    Number of Children: 2  . Years of Education: N/A   Occupational History  . retired    Social History Main Topics  . Smoking status: Never Smoker   . Smokeless tobacco: Never Used  . Alcohol Use: Yes  . Drug Use: No  . Sexual Activity: Not on file   Other Topics Concern  . Not on file   Social History Narrative  . No narrative on file      Review of Systems: All other systems reviewed and are otherwise negative except as noted above.  Physical Exam: Blood pressure 129/92, pulse 103, temperature 98.1 F (36.7 C), temperature source Oral, resp. rate 15, SpO2 97.00%.  General: Well developed, well nourished, in no acute distress. Head: Normocephalic, atraumatic, sclera non-icteric, no xanthomas, nares are without discharge.  Neck: Negative for carotid bruits. JVD not elevated. Lungs: Clear bilaterally to auscultation without wheezes, rales, or rhonchi. Breathing is unlabored. Heart: Irregularly irregular with S1 S2. No murmurs, rubs, or gallops appreciated. Abdomen: Soft, non-tender, non-distended with normoactive bowel sounds. No hepatomegaly. No rebound/guarding. No obvious abdominal masses. Msk:  Strength and tone appears normal for age. Extremities: No clubbing, cyanosis.  Trace B/L LE pitting edema.  Distal pedal pulses are 2+ and equal bilaterally. Neuro: Alert and oriented X 3. Moves all extremities spontaneously. Psych:  Responds to questions appropriately with a normal affect.  Labs: Recent Labs     02/08/13  2120  WBC  8.0  HGB  15.9*  HCT  44.3  MCV  93.1  PLT  316   No results found for this basename: VITAMINB12, FOLATE, FERRITIN, TIBC, IRON, RETICCTPCT,  in the last 72 hours No results found for this basename: DDIMER,  in the last 72 hours  Recent Labs Lab 02/08/13 2120  NA 135  K 3.9  CL 99  CO2 23  BUN 8  CREATININE 0.64  CALCIUM 10.0  PROT 7.7  BILITOT 0.5  ALKPHOS 78  ALT 56*  AST 63*  GLUCOSE 106*   No results found for this basename: HGBA1C,  in the last 72 hours No results found for this basename: CKTOTAL, CKMB, CKMBINDEX, TROPONINI,  in the last 72 hours No components found with this basename: POCBNP,  No results found for this basename: CHOL, HDL, LDLCALC, TRIG, CHOLHDL, LDLDIRECT,  in the last 72 hours No results found for this basename: TSH, T4TOTAL, FREET3, T3FREE, THYROIDAB,   in the last 72 hours  Radiology/Studies: Dg Chest Portable 1 View  02/08/2013   CLINICAL DATA:  Chest pressure.  EXAM: PORTABLE CHEST - 1 VIEW  COMPARISON:  None.  FINDINGS: Moderate cardiomegaly noted with indistinct pulmonary vasculature but without overt edema. No significant airway thickening.  Mild levoconvex thoracic scoliosis. The density projecting over the left upper lung and scapula noted.  IMPRESSION: 1. Cardiomegaly with a suggestion of pulmonary venous hypertension but without overt edema. 2. New equivocal vague density projecting over the left upper lung and scapula, probably artifact or skin fold. Followup chest radiography in 4-6 weeks time is recommended to ensure clearance and exclude the unlikely possibility of a pulmonary nodule. If the patient has a history of malignancy then chest CT might be utilized for definitive assessment.   Electronically Signed   By: Herbie Baltimore M.D.  On: 02/08/2013 21:33    02/09/13 12-lead ECG:  Atrial Fibrillation with RVR at 157 bpm with nonspecific ST-T changes.  ASSESSMENT:  72 yo WF with fairly limited PMHx of GERD, diverticulosis, internal hemorrhoids, HLD and hypothyroidism who presents with new/recent onset Atrial Fibrillation found to be in RVR and otherwise normotensive with mild palpitations at rest.  PLAN:  1-Observation status to telemetry bed. 2-New/recent onset AFib:  Rate control overnight with CCB +/- BB, obtain 2D surface Echo in AM to evaluate LV function, valves and also plan tentatively for TEE+DCCV if team agrees on rhythm control strategy.  Start anticoagulation with Heparin infusion AFib protocol.  Consider warfarin vs. NOAC for long-term anticoagulation based on CHADS2-Vasc.  Will check daily PT/INR. 3-R/O ACS although presentation less likely to be associated with an ACS history. 4-Continue synthroid and check TSH as well as HbA1c. 5-Routine labs in AM.  Code Status:  FULL CODE.  Signed, Christie Nottingham, MD Cardiology  Fellow, [employer] 02/09/2013, 12:00 AM

## 2013-02-09 NOTE — Telephone Encounter (Signed)
Patient is currently admitted to the hospital due to atrial fibrillation found on her physical exam yesterday.  Originally, Dr Juanda Chance ordered an abdominal ultrasound to be completed as an outpatient for patient's symptoms of dyspepsia. Since she is now hospitalized, Dr Juanda Chance has sent a note to Nicolasa Ducking, PA-C in cardiology to request that an abdominal ultrasound be ordered inpatient. Dr Juanda Chance also originally wanted patient to have an endoscopy due to bloating and dyspepsia as well as a colonoscopy (at time of endoscopy) for recall. Due to recent A Fib and strong possibility patient will be put on anticoagulants, Dr Juanda Chance has decided to hold off on EGD/Colon at this time. She would like for patient to receive a recall endo/colon letter in 2 months or so at which time we will reassess her situation. Recall entered in EPIC and I will contact the patient once she is out of the hospital to advise her of this.

## 2013-02-09 NOTE — ED Provider Notes (Signed)
I saw and evaluated the patient, reviewed the resident's note and I agree with the findings and plan.  Please see my separate note regarding my evaluation of the patient.     Vida Roller, MD 02/09/13 [phone]

## 2013-02-09 NOTE — Progress Notes (Addendum)
Patient Name: Janice David Date of Encounter: 02/09/2013    Principal Problem:   Atrial fibrillation with RVR Active Problems:   HLD (hyperlipidemia)   Hypothyroidism   GERD (gastroesophageal reflux disease)   SUBJECTIVE  Patient is a 72 year old female with no previous history of cardiac disease. PMH HLD, diverticulosis, hypothyroidism, internal hemorrhoids, and GERD. Mother was diagnosed with CHF in her 63s. Patient has been feeling fatigued, and SOB on exertion for the past year. 02/08/2013 she went to her GI doctor; her pulse was irregular. She did not feel palpitations, dizziness, pain, or SOB. ECG at office showed A-fib. GI doctor recommended that she went to the ED which she did after daughter insisted. Currently, she is feeling pressure in her chest when she belches; tender on palpation. Heart rate 90s-103; no SOB, dizziness, palpitations, edema; 0/10 pain. ECG Atrial fibrillation with RVR; negative troponin.  CURRENT MEDS . levothyroxine  50 mcg Oral QAC breakfast  . sodium chloride  3 mL Intravenous Q12H   OBJECTIVE  Filed Vitals:   02/08/13 2300 02/09/13 0000 02/09/13 0036 02/09/13 0444  BP: 129/92 127/73 137/86 108/68  Pulse: 103 134 121 97  Temp:   98 F (36.7 C) 98.5 F (36.9 C)  TempSrc:   Oral Oral  Resp: 15 17 18 18   Height:  5' 2.21" (1.58 m) 5\' 2"  (1.575 m)   Weight:  198 lb 6.6 oz (90 kg) 194 lb 4.8 oz (88.134 kg)   SpO2: 97% 98% 98% 94%   No intake or output data in the 24 hours ending 02/09/13 0704 Filed Weights   02/09/13 0000 02/09/13 0036  Weight: 198 lb 6.6 oz (90 kg) 194 lb 4.8 oz (88.134 kg)    PHYSICAL EXAM  General: Pleasant, NAD. Neuro: Alert and oriented X 3. Moves all extremities spontaneously. Psych: Normal affect. HEENT:  Normal  Neck: Supple without bruits or JVD. Lungs:  Resp regular and unlabored, CTA. Heart: irregular rhythm, regular rate, no s3, s4, or murmurs. Abdomen: Soft, non-tender, non-distended, BS + x 4.    Extremities: No clubbing, cyanosis or edema. DP/PT/Radials 2+ and equal bilaterally.  Accessory Clinical Findings  CBC  Recent Labs  02/08/13 2120 02/09/13 0550  WBC 8.0 7.4  NEUTROABS 3.5 3.4  HGB 15.9* 14.1  HCT 44.3 40.7  MCV 93.1 94.2  PLT 316 285   Basic Metabolic Panel  Recent Labs  02/08/13 2120 02/09/13 0550  NA 135 142  K 3.9 3.9  CL 99 107  CO2 23 26  GLUCOSE 106* 109*  BUN 8 6  CREATININE 0.64 0.60  CALCIUM 10.0 9.6  MG  --  2.3   Liver Function Tests  Recent Labs  02/08/13 2120  AST 63*  ALT 56*  ALKPHOS 78  BILITOT 0.5  PROT 7.7  ALBUMIN 4.3   Cardiac Enzymes  Recent Labs  02/09/13 0550  TROPONINI <0.30   TELE  Atrial Fibrillation; heart rate 90s to 100s, 18 RR 108/68, SpO2 94% RA.  ECG  Atrial Fibrillation with RVR, 157. Somewhat diffuse upsloping st depression. No previous ECG for comparison.  Radiology/Studies  Dg Chest Portable 1 View  02/08/2013   CLINICAL DATA:  Chest pressure.  EXAM: PORTABLE CHEST - 1 VIEW  COMPARISON:  None.  FINDINGS: Moderate cardiomegaly noted with indistinct pulmonary vasculature but without overt edema. No significant airway thickening.  Mild levoconvex thoracic scoliosis. The density projecting over the left upper lung and scapula noted.  IMPRESSION: 1. Cardiomegaly with a suggestion of  pulmonary venous hypertension but without overt edema. 2. New equivocal vague density projecting over the left upper lung and scapula, probably artifact or skin fold. Followup chest radiography in 4-6 weeks time is recommended to ensure clearance and exclude the unlikely possibility of a pulmonary nodule. If the patient has a history of malignancy then chest CT might be utilized for definitive assessment.   Electronically Signed   By: Janice David M.D.   On: 02/08/2013 21:33   ASSESSMENT AND PLAN  1. Afib with RVR heart rate 157; ECG 02/08/13. Current HR 90s to 100s; patient is asymptomatic. Diltiazem infusion 10mg /hr;  start po. Recommend cardioversion in a few weeks if still in Afib. Recommend oral anticoagulation.   Signed, Janice David, [employer] NP student  Addendum:  Pt seen and examined with Janice David and history extensively reviewed.  Janice David has been experiencing intermittent palpitations, restlessness, and anxiety over a period of about six months.  She says that she had an ECG performed in June by her PCP 2/2 to these Ss and it apparently showed sinus rhythm.  In August, she noted intermittent DOE, but overall has been feeling well.  She was seen in GI clinic yesterday to schedule colonoscopy (routine) and was found to be tachycardic on exam ->afib rvr.  She was admitted to [facility] last night and her rates on IV Dilt @ 10mg /hr have improved significantly, currently in the 90's to low 100's.  She denies chest pain, pnd, orthopnea, n, v, dizziness, syncope, edema, weight gain, or early satiety.  Exam: pleasant, nad, aaox3, vss. Neck supple w/o bruits/jvd. Lungs cta. Cor ir ir w/o murmurs. abd soft nt/nd/bs+x4, ext trace bilat LEE.  dp 2+ bilat. Labs, ECG, and tele reviewed as above. A/P:  1.  Afib RVR:  Pt believes that this may have been intermittent over the past 6 months but cannot be sure.  Current duration unclear.  Her rate has improved on IV dilt and she is currently asymptomatic.  Will switch her to dilt 60mg  q 6h and add low dose bb.  She is currently on heparin.  Will plan to switch her to a NOAC (Case mgmt looking into feasibility) today and check echo.  If rates remain </= 100 with ambulation and she is asymptomatic come tomorrow, we will plan to d/c her with outpt f/u in 3 wks and subsequent dccv if she remains in afib @ that time.    CHADS2  Vasc = 2~3.  She has had some HTN in the past. .     2. Abnl CXR:  CXR on admission showed new equivocal vague density projecting over the left upper lung and scapula, probably artifact or skin fold, with recommendation for f/u CXR in 4-6 wks.  This will need  to be scheduled as an outpt.  3.  Hypothyroidism:  Cont synthroid.  TSH pending.  4.  HL:  Follow up lipids in am.  Not on statin.  Janice Ducking, NP 10:54 AM 02/09/2013   Attending Note:   The patient was seen and examined.  Agree with assessment and plan as noted above.  Changes made to the above note as needed.  Lorrayne presents with unusual symptom for the past 6 months or so.  She has not been given the dx of HTN but has had elevated BP on occasion - this may be enough to be a risk factor in the CHADS VASC scoring.    I would favor 1 month of anticoagulation followed by cardioversion (  assuming that she does not have PAF).  Agree with PO dilt and metoprolol.  Echo to be done today.  Will start Xarelto tonight.  Anticipate DC tomorrow.  She may eat today   Alvia Grove., MD, [facility] 02/09/2013, 11:59 AM

## 2013-02-09 NOTE — Progress Notes (Signed)
ANTICOAGULATION CONSULT NOTE - Follow-up Consult  Pharmacy Consult for Heparin ->Xarelto Indication: atrial fibrillation  Allergies  Allergen Reactions  . Sulfonamide Derivatives     Patient Measurements: Height: 5\' 2"  (157.5 cm) Weight: 194 lb 4.8 oz (88.134 kg) IBW/kg (Calculated) : 50.1 Heparin Dosing Weight: 75 kg  Vital Signs: Temp: 98.5 F (36.9 C) (10/08 0444) Temp src: Oral (10/08 0444) BP: 132/76 mmHg (10/08 1223) Pulse Rate: 95 (10/08 1223)  Labs:  Recent Labs  02/08/13 2120 02/09/13 0550 02/09/13 0830 02/09/13 0836  HGB 15.9* 14.1  --   --   HCT 44.3 40.7  --   --   PLT 316 285  --   --   LABPROT  --  14.0  --   --   INR  --  1.10  --   --   HEPARINUNFRC  --   --  0.70  --   CREATININE 0.64 0.60  --   --   TROPONINI  --  <0.30  --  <0.30    Estimated Creatinine Clearance: 66.5 ml/min (by C-G formula based on Cr of 0.6).  Assessment: 72 yo female on heparin for afib. Heparin level 0.7 (therapeutic) on 1200 units/hr. To start Xarelto tonight.  Goal of Therapy:  Heparin level 0.3-0.7 units/ml Monitor platelets by anticoagulation protocol: Yes   Plan:  Xarelto 20mg  po daily with supper D/c heparin when Xarelto given. D/c heparin levels  Christoper Fabian, PharmD, BCPS Clinical pharmacist, pager [phone] 02/09/2013,12:31 PM

## 2013-02-09 NOTE — Progress Notes (Signed)
ANTICOAGULATION CONSULT NOTE - Initial Consult  Pharmacy Consult for Heparin Indication: atrial fibrillation  Allergies  Allergen Reactions  . Sulfonamide Derivatives     Patient Measurements: Height: 5' 2.21" (158 cm) Weight: 198 lb 6.6 oz (90 kg) IBW/kg (Calculated) : 50.57 Heparin Dosing Weight: 75 kg  Vital Signs: Temp: 98.1 F (36.7 C) (10/07 2102) Temp src: Oral (10/07 2102) BP: 127/73 mmHg (10/08 0000) Pulse Rate: 134 (10/08 0000)  Labs:  Recent Labs  02/08/13 2120  HGB 15.9*  HCT 44.3  PLT 316  CREATININE 0.64    Estimated Creatinine Clearance: 67.6 ml/min (by C-G formula based on Cr of 0.64).   Medical History: Past Medical History  Diagnosis Date  . Diverticulosis   . Internal hemorrhoids   . Anemia   . Asthma   . HLD (hyperlipidemia)   . Hypothyroidism   . GERD (gastroesophageal reflux disease)     Medications:  Synthoid  Vit B  Vit D  Vit C  Assessment: 72 yo female with Afib for heparin  Goal of Therapy:  Heparin level 0.3-0.7 units/ml Monitor platelets by anticoagulation protocol: Yes   Plan:  Heparin 3000 units IV bolus, then 1200 units/hr Check heparin level in 6 hours.  Eros Montour, Gary Fleet 02/09/2013,12:17 AM

## 2013-02-09 NOTE — Care Management Note (Unsigned)
    Page 1 of 1   02/09/2013     10:32:40 AM   CARE MANAGEMENT NOTE 02/09/2013  Patient:  Janice David, Janice David   Account Number:  [id number]  Date Initiated:  02/09/2013  Documentation initiated by:  GRAVES-BIGELOW,Governor Matos  Subjective/Objective Assessment:   Pt admitted for afib with RVR. Initiated on cardizem gtt.     Action/Plan:   CM received a referral for medication assistance. Pt may be d/c home on eliquis or xarelto. Will do a benefits check for co pay cost. Will make pt aware once complete.   Anticipated DC Date:  02/10/2013   Anticipated DC Plan:  HOME/SELF CARE         Choice offered to / List presented to:             Status of service:  In process, will continue to follow Medicare Important Message given?   (If response is "NO", the following Medicare IM given date fields will be blank) Date Medicare IM given:   Date Additional Medicare IM given:    Discharge Disposition:    Per UR Regulation:  Reviewed for med. necessity/level of care/duration of stay  If discussed at Long Length of Stay Meetings, dates discussed:    Comments:

## 2013-02-09 NOTE — Plan of Care (Signed)
Problem: Phase I Progression Outcomes Goal: Anticoagulation Therapy per MD order Outcome: Completed/Met Date Met:  02/09/13 Iv heparin    Goal: Heart rate or rhythm control medication Outcome: Completed/Met Date Met:  02/09/13 Iv cardizem

## 2013-02-10 ENCOUNTER — Telehealth: Payer: Self-pay | Admitting: *Deleted

## 2013-02-10 DIAGNOSIS — I059 Rheumatic mitral valve disease, unspecified: Secondary | ICD-10-CM

## 2013-02-10 LAB — LIPID PANEL
Cholesterol: 177 mg/dL (ref 0–200)
HDL: 65 mg/dL (ref >39)
Triglycerides: 95 mg/dL (ref <150)

## 2013-02-10 LAB — CBC WITH DIFFERENTIAL/PLATELET
Basophils Absolute: 0 10*3/uL (ref 0.0–0.1)
Basophils Relative: 0 % (ref 0–1)
Eosinophils Relative: 2 % (ref 0–5)
HCT: 39.4 % (ref 36.0–46.0)
Hemoglobin: 13.4 g/dL (ref 12.0–15.0)
Lymphs Abs: 2.3 10*3/uL (ref 0.7–4.0)
MCH: 32.1 pg (ref 26.0–34.0)
MCHC: 34 g/dL (ref 30.0–36.0)
MCV: 94.3 fL (ref 78.0–100.0)
Monocytes Absolute: 0.4 10*3/uL (ref 0.1–1.0)
Monocytes Relative: 6 % (ref 3–12)
Neutrophils Relative %: 54 % (ref 43–77)
RDW: 12.5 % (ref 11.5–15.5)

## 2013-02-10 LAB — BASIC METABOLIC PANEL
BUN: 9 mg/dL (ref 6–23)
CO2: 25 mEq/L (ref 19–32)
Calcium: 9.4 mg/dL (ref 8.4–10.5)
Chloride: 106 mEq/L (ref 96–112)
Creatinine, Ser: 0.69 mg/dL (ref 0.50–1.10)
GFR calc Af Amer: >90 mL/min (ref >90)
GFR calc non Af Amer: 86 mL/min — ABNORMAL LOW (ref >90)
Glucose, Bld: 92 mg/dL (ref 70–99)

## 2013-02-10 LAB — MAGNESIUM: Magnesium: 2.3 mg/dL (ref 1.5–2.5)

## 2013-02-10 MED ORDER — METOPROLOL TARTRATE 50 MG PO TABS
50.0000 mg | ORAL_TABLET | Freq: Two times a day (BID) | ORAL | Status: DC
  Administered 2013-02-10: 50 mg via ORAL
  Filled 2013-02-10 (×2): qty 1

## 2013-02-10 MED ORDER — DIGOXIN 125 MCG PO TABS: 0.1250 mg | ORAL_TABLET | Freq: Every day | ORAL | Status: DC

## 2013-02-10 MED ORDER — OFF THE BEAT BOOK
Freq: Once | Status: AC
  Administered 2013-02-10: 08:00:00
  Filled 2013-02-10 (×2): qty 1

## 2013-02-10 MED ORDER — METOPROLOL TARTRATE 50 MG PO TABS: 50.0000 mg | ORAL_TABLET | Freq: Two times a day (BID) | ORAL | Status: DC

## 2013-02-10 MED ORDER — RIVAROXABAN 20 MG PO TABS: 20.0000 mg | ORAL_TABLET | Freq: Every day | ORAL | Status: DC

## 2013-02-10 MED ORDER — DIGOXIN 250 MCG PO TABS
0.2500 mg | ORAL_TABLET | Freq: Every day | ORAL | Status: DC
Start: 2013-02-10 — End: 2013-02-10
  Administered 2013-02-10: 0.25 mg via ORAL
  Filled 2013-02-10: qty 1

## 2013-02-10 NOTE — Progress Notes (Signed)
Patient Name: Janice David Date of Encounter: 02/10/2013    Principal Problem:   Atrial fibrillation with RVR Active Problems:   HLD (hyperlipidemia)   Hypothyroidism   GERD (gastroesophageal reflux disease)   SUBJECTIVE  Patient is a 72 year old female with no previous history of cardiac disease. PMH HLD, diverticulosis, hypothyroidism, internal hemorrhoids, and GERD. Mother was diagnosed with CHF in her 31s. Patient has been feeling fatigued, and SOB on exertion for the past year. 02/08/2013 she went to her GI doctor; her pulse was irregular. She did not feel palpitations, dizziness, pain, or SOB. ECG at office showed A-fib. GI doctor recommended that she went to the ED which she did after daughter insisted. Currently, she is feeling pressure in her chest when she belches; tender on palpation. Heart rate 90s-103; no SOB, dizziness, palpitations, edema; 0/10 pain. ECG Atrial fibrillation with RVR; negative troponin.  CURRENT MEDS . diltiazem  60 mg Oral Q6H  . levothyroxine  50 mcg Oral QAC breakfast  . metoprolol tartrate  25 mg Oral BID  . off the beat book   Does not apply Once  . rivaroxaban  20 mg Oral Q supper  . sodium chloride  3 mL Intravenous Q12H   OBJECTIVE  Filed Vitals:   02/09/13 2201 02/09/13 2339 02/10/13 0528 02/10/13 0612  BP: 128/89 90/48 110/69   Pulse: 114 88 102   Temp:   98 F (36.7 C)   TempSrc:   Oral   Resp:   18   Height:      Weight:    195 lb (88.451 kg)  SpO2:   95%     Intake/Output Summary (Last 24 hours) at 02/10/13 0746 Last data filed at 02/09/13 2236  Gross per 24 hour  Intake    903 ml  Output      0 ml  Net    903 ml   Filed Weights   02/09/13 0000 02/09/13 0036 02/10/13 0612  Weight: 198 lb 6.6 oz (90 kg) 194 lb 4.8 oz (88.134 kg) 195 lb (88.451 kg)    PHYSICAL EXAM  General: Pleasant, NAD. Neuro: Alert and oriented X 3. Moves all extremities spontaneously. Psych: Normal affect. HEENT:  Normal  Neck: Supple without  bruits or JVD. Lungs:  Resp regular and unlabored, CTA. Heart: irregular rhythm, regular rate, no s3, s4, or murmurs. Abdomen: Soft, non-tender, non-distended, BS + x 4.  Extremities: No clubbing, cyanosis or edema. DP/PT/Radials 2+ and equal bilaterally.  Accessory Clinical Findings  CBC  Recent Labs  02/09/13 0550 02/10/13 0400  WBC 7.4 6.1  NEUTROABS 3.4 3.3  HGB 14.1 13.4  HCT 40.7 39.4  MCV 94.2 94.3  PLT 285 271   Basic Metabolic Panel  Recent Labs  02/09/13 0550 02/10/13 0400  NA 142 139  K 3.9 4.1  CL 107 106  CO2 26 25  GLUCOSE 109* 92  BUN 6 9  CREATININE 0.60 0.69  CALCIUM 9.6 9.4  MG 2.3 2.3   Liver Function Tests  Recent Labs  02/08/13 2120  AST 63*  ALT 56*  ALKPHOS 78  BILITOT 0.5  PROT 7.7  ALBUMIN 4.3   Cardiac Enzymes  Recent Labs  02/09/13 0550 02/09/13 0836 02/09/13 1436  TROPONINI <0.30 <0.30 <0.30   TELE  Atrial Fibrillation; heart rate 90s to 100s, 18 RR 108/68, SpO2 94% RA.  ECG  Atrial Fibrillation with RVR, 157. Somewhat diffuse upsloping st depression. No previous ECG for comparison.  Radiology/Studies  Dg Chest  Portable 1 View  02/08/2013   CLINICAL DATA:  Chest pressure.  EXAM: PORTABLE CHEST - 1 VIEW  COMPARISON:  None.  FINDINGS: Moderate cardiomegaly noted with indistinct pulmonary vasculature but without overt edema. No significant airway thickening.  Mild levoconvex thoracic scoliosis. The density projecting over the left upper lung and scapula noted.  IMPRESSION: 1. Cardiomegaly with a suggestion of pulmonary venous hypertension but without overt edema. 2. New equivocal vague density projecting over the left upper lung and scapula, probably artifact or skin fold. Followup chest radiography in 4-6 weeks time is recommended to ensure clearance and exclude the unlikely possibility of a pulmonary nodule. If the patient has a history of malignancy then chest CT might be utilized for definitive assessment.    Electronically Signed   By: Herbie Baltimore M.D.   On: 02/08/2013 21:33   ASSESSMENT AND PLAN   1.  Afib RVR:  Pt believes that this may have been intermittent over the past 6 months but cannot be sure.  Current duration unclear.  Her rate has improved on IV dilt and she is currently asymptomatic.  She has been intolerant to PO Dilt - causes some nausea and ? Diarrhea.  We may need to go with PO metoprolol alone for rate control.   Will try digoxin also.   Echo shows EF 50-55%.  No significant valvular disease.   Will DC her to home today on Metoprolol and low dose digoxin and xarelto.     2. Abnl CXR:  CXR on admission showed new equivocal vague density projecting over the left upper lung and scapula, probably artifact or skin fold, with recommendation for f/u CXR in 4-6 wks.  This will need to be scheduled as an outpt.  3.  Hypothyroidism:  Cont synthroid.  TSH is normal - 4.2.  4.  HL:  Follow up lipids in am.  Not on statin.   5. GI :  Will follow up with Dr. Juanda Chance.  Abdominal US looked OK   Vesta Mixer, Montez Hageman., MD, [facility] 02/10/2013, 7:46 AM

## 2013-02-10 NOTE — Discharge Summary (Signed)
CARDIOLOGY DISCHARGE SUMMARY   Patient ID: Janice David MRN: [id number] DOB/AGE: 71/03/1941 71 y.o.  Admit date: 02/08/2013 Discharge date: 02/10/2013  Primary Discharge Diagnosis:    Atrial fibrillation with RVR  Secondary Discharge Diagnosis:    HLD (hyperlipidemia)   Hypothyroidism   GERD (gastroesophageal reflux disease)  Procedures:  2D echocardiogram  Hospital Course: Janice David is a 72 y.o. female with no history of CAD. She went to Dr. Verlee Monte Brodie's office for scheduled follow-up and was noted to have an irregular/rapid heartbeat. An ECG showed rapid atrial fibrillation and she was sent to the ER, and admitted for further evaluation and treatment.   Her cardiac enzymes were negative for MI. Her TSH was checked and was within normal limits so no dose change on her synthroid was recommended.   Her heart rate improved on IV Cardizem. Oral Cardizem was attempted but she had significant side effects and it was discontinued. She was started on a beta blocker, which also worked well for heart rate control, without significant side effects. The duration of the fibrillation is unclear, so rate control and OP DCCV is planned.  She had had some chest pain prior to admission. Cardiac enzymes were cycled and were negative for MI. An echocardiogram was performed, which showed preserved ventricular function and no WMAs. With improved heart rate control, she had no chest pain.  Her LFTs were slightly elevated so she had an abdominal US, results below. There were no critical abnormalities so she can follow up with Dr. Juanda Chance as an outpatient.   Her CXR was noted to have a possible abnormality. She needs a f/u CXR in 4-6 months. We can obtain.   She is an anticoagulation candidate and was started on Xarelto. Case management evaluated her copay and the patient stated she would be able to afford it.   On 02/10/2013, Janice David was evaluated by Dr. Elease Hashimoto. She was ambulating without  difficulty and considered stable for discharge, to follow up as an outpatient.  Labs:  Lab Results  Component Value Date   WBC 6.1 02/10/2013   HGB 13.4 02/10/2013   HCT 39.4 02/10/2013   MCV 94.3 02/10/2013   PLT 271 02/10/2013    Recent Labs Lab 02/08/13 2120  02/10/13 0400  NA 135  < > 139  K 3.9  < > 4.1  CL 99  < > 106  CO2 23  < > 25  BUN 8  < > 9  CREATININE 0.64  < > 0.69  CALCIUM 10.0  < > 9.4  PROT 7.7  --   --   BILITOT 0.5  --   --   ALKPHOS 78  --   --   ALT 56*  --   --   AST 63*  --   --   GLUCOSE 106*  < > 92  < > = values in this interval not displayed.  Recent Labs  02/09/13 0550 02/09/13 0836 02/09/13 1436  TROPONINI <0.30 <0.30 <0.30   Lipid Panel     Component Value Date/Time   CHOL 177 02/10/2013 0400   TRIG 95 02/10/2013 0400   HDL 65 02/10/2013 0400   CHOLHDL 2.7 02/10/2013 0400   VLDL 19 02/10/2013 0400   LDLCALC 93 02/10/2013 0400    Recent Labs  02/09/13 0550  INR 1.10   Lab Results  Component Value Date   TSH 4.157 02/09/2013     Radiology: US Abdomen Complete 02/10/2013  CLINICAL DATA:  Dyspepsia/bloating  EXAM: ULTRASOUND ABDOMEN COMPLETE  COMPARISON:  None.  FINDINGS: Gallbladder  No gallstones, gallbladder wall thickening, or pericholecystic fluid. Negative sonographic Murphy's sign.  Common bile duct  Diameter: 4 mm.  Liver  Hyperechoic hepatic parenchyma, raising the possibility of hepatic steatosis. No focal hepatic lesion is seen.  IVC  No abnormality visualized.  Pancreas  Incompletely visualized but grossly unremarkable.  Spleen  Measures 7.5 cm.  Right Kidney  Length: 10.2 cm. Echogenicity within normal limits. No mass or hydronephrosis visualized.  Left Kidney  Length: 11.1 cm. Echogenicity within normal limits. No mass or hydronephrosis visualized.  Abdominal aorta  No aneurysm visualized.  IMPRESSION: Possible hepatic steatosis.  Otherwise negative abdominal ultrasound.   Electronically Signed   By: Charline Bills M.D.   On:  02/10/2013 08:07   Dg Chest Portable 1 View 02/08/2013   CLINICAL DATA:  Chest pressure.  EXAM: PORTABLE CHEST - 1 VIEW  COMPARISON:  None.  FINDINGS: Moderate cardiomegaly noted with indistinct pulmonary vasculature but without overt edema. No significant airway thickening.  Mild levoconvex thoracic scoliosis. The density projecting over the left upper lung and scapula noted.  IMPRESSION: 1. Cardiomegaly with a suggestion of pulmonary venous hypertension but without overt edema. 2. New equivocal vague density projecting over the left upper lung and scapula, probably artifact or skin fold. Followup chest radiography in 4-6 weeks time is recommended to ensure clearance and exclude the unlikely possibility of a pulmonary nodule. If the patient has a history of malignancy then chest CT might be utilized for definitive assessment.   Electronically Signed   By: Herbie Baltimore M.D.   On: 02/08/2013 21:33   EKG:  08-Feb-2013 20:56:41  Atrial fibrillation with rapid ventricular response Nonspecific ST and T wave abnormality Vent. rate 157 BPM PR interval * ms QRS duration 84 ms QT/QTc 298/481 ms P-R-T axes * 40 46  Echo:  02/10/2013 Conclusions - Left ventricle: The cavity size was normal. Wall thickness was normal. Systolic function was normal. The estimated ejection fraction was in the range of 55% to 60%. Wall motion was normal; there were no regional wall motion abnormalities. - Mitral valve: Mild regurgitation. - Right atrium: The atrium was mildly dilated.  FOLLOW UP PLANS AND APPOINTMENTS Allergies  Allergen Reactions  . Diltiazem Hcl     IV was OK, oral med caused nausea and possibly diarrhea.  . Sulfonamide Derivatives      Medication List         digoxin 0.125 MG tablet  Commonly known as:  LANOXIN  Take 1 tablet (0.125 mg total) by mouth daily.  Start taking on:  02/11/2013     levothyroxine 50 MCG tablet  Commonly known as:  SYNTHROID, LEVOTHROID  Take 50 mcg by mouth  daily before breakfast.     metoprolol 50 MG tablet  Commonly known as:  LOPRESSOR  Take 1 tablet (50 mg total) by mouth 2 (two) times daily.     Rivaroxaban 20 MG Tabs tablet  Commonly known as:  XARELTO  Take 1 tablet (20 mg total) by mouth daily with supper.     STRESS B COMPLEX PO  Take 1 tablet by mouth daily.     vitamin C 500 MG tablet  Commonly known as:  ASCORBIC ACID  Take 500 mg by mouth daily.     Vitamin D 1000 UNITS capsule  Take 2,000 Units by mouth daily.         Future Appointments Provider  Department Dept Phone   02/28/2013 9:30 AM Rosalio Macadamia, NP [facility] [place] Office [phone]   03/10/2013 10:45 AM Vesta Mixer, MD [facility] [place] Office [phone]     Follow-up Information   Follow up with Norma Fredrickson, NP On 02/28/2013. (at 9:30 am)    Specialty:  Nurse Practitioner   Contact information:   [address] [place] [address] [phone]       Follow up with Elyn Aquas., MD On 03/10/2013. (at 10:45 am.)    Specialty:  Cardiology   Contact information:   [address] [place] [address] [phone]       BRING ALL MEDICATIONS WITH YOU TO FOLLOW UP APPOINTMENTS  Time spent with patient to include physician time: 38 min Signed: Theodore Demark, PA-C 02/10/2013, 12:40 PM Co-Sign MD  Attending Note:   The patient was seen and examined.  Agree with assessment and plan as noted above.  Changes made to the above note as needed. See my note from earlier today   Alvia Grove., MD, [facility] 02/10/2013, 7:25 PM

## 2013-02-10 NOTE — Progress Notes (Signed)
2D Echocardiogram has been performed.  Janice David 02/10/2013, 9:18 AM

## 2013-02-10 NOTE — Telephone Encounter (Signed)
xarelto approved through [employer] [id number] until 02/10/2014

## 2013-02-14 ENCOUNTER — Ambulatory Visit (HOSPITAL_COMMUNITY): Payer: Medicare Other

## 2013-02-14 ENCOUNTER — Telehealth: Payer: Self-pay | Admitting: *Deleted

## 2013-02-14 NOTE — Telephone Encounter (Signed)
I have spoken to patient at length. I have advised her of Dr Regino Schultze recommendation to hold off on endoscopy/colonoscopy at this time. I advised that we will wait for a couple months until things are sorted out regarding her atrial fibrillation. She verbalizes understanding of this and states that her cardiologist prefers this approach as well. I advised that we will send a reminder in the mail to her at which time we will revisit her need and risks vs benefits of procedure and whether to continue her anticoagulation for the procedure or not. She states that she does not want to start the omeprazole or anusol suppositories at this time because she is adjusting to her other new medications. She states that she was actually given the omeprazole in the past but stopped using it because of the "contraversy" around taking medications like this. She states that her hemorrhoids are bothersome but not so much that she feels she should use the suppositories. Patient also questions why she had an abdominal ultrasound why hospitalized. I have gone over this with her as well as the fact that possible fatty liver was found. She is to maintain a low fat diet and exercise regularly. She verbalizes understanding of this as well.

## 2013-02-18 ENCOUNTER — Telehealth: Payer: Self-pay | Admitting: Cardiovascular Disease

## 2013-02-18 NOTE — Telephone Encounter (Signed)
New message    In afib---can she still have a bone density and 3D mammogram test already scheduled for next week?

## 2013-02-18 NOTE — Telephone Encounter (Signed)
I spoke with the pt and made her aware that she is okay to proceed with these tests.

## 2013-02-21 ENCOUNTER — Encounter: Payer: Self-pay | Admitting: Internal Medicine

## 2013-02-28 ENCOUNTER — Encounter: Payer: Medicare Other | Admitting: Nurse Practitioner

## 2013-03-01 ENCOUNTER — Encounter: Payer: Self-pay | Admitting: Nurse Practitioner

## 2013-03-01 ENCOUNTER — Ambulatory Visit (INDEPENDENT_AMBULATORY_CARE_PROVIDER_SITE_OTHER): Payer: Medicare Other | Admitting: Nurse Practitioner

## 2013-03-01 ENCOUNTER — Ambulatory Visit
Admission: RE | Admit: 2013-03-01 | Discharge: 2013-03-01 | Disposition: A | Discharge status: Home / Self Care | Payer: Medicare Other | Source: Ambulatory Visit | Attending: Nurse Practitioner | Admitting: Nurse Practitioner

## 2013-03-01 VITALS — BP 130/70 | HR 102 | Ht 62.5 in | Wt 197.1 lb

## 2013-03-01 DIAGNOSIS — I4891 Unspecified atrial fibrillation: Secondary | ICD-10-CM

## 2013-03-01 NOTE — Patient Instructions (Addendum)
See Dr. Elease Hashimoto as planned in November - will arrange for cardioversion at that visit  I am sending you are a repeat CXR - go to [facility] at the [facility] on the first floor  Stay on your current medicines  Call the [facility] office at [phone] if you have any questions, problems or concerns.

## 2013-03-01 NOTE — Progress Notes (Signed)
Kathlene November Date of Birth: 1940/05/06 Medical Record #[id number]  History of Present Illness: Ms. Wahlberg is seen back today for a post hospital visit. Seen for Dr. Elease Hashimoto. She has no known history of CAD. She has GERD, HLD and hypothyroidism.  Earlier this month she presented to the GI office for scheduled follow up - noted to have an irregular heart beat - EKG showed atrial fib with RVR - sent to the hospital and was admitted. TSH was normal. She was managed with rate control with plans for outpatient DCCV. She did not tolerate Cardizem. LFTs were up slightly and she had an ultrasound - no critical abnormalities noted. Started on Xarelto.   Comes back today. Here alone. Has lots of questions - has been on MyChart - saw her CXR - says never told about her CXR. Little fatigue and little short of breath. No real palpitations. No chest pain. Asking about a support group. Asking about her GFR. Tolerating her medicines ok.    Current Outpatient Prescriptions  Medication Sig Dispense Refill  . B Complex-C-Folic Acid (STRESS B COMPLEX PO) Take 1 tablet by mouth daily.      . Cholecalciferol (VITAMIN D) 1000 UNITS capsule Take 2,000 Units by mouth daily.      . digoxin (LANOXIN) 0.125 MG tablet Take 1 tablet (0.125 mg total) by mouth daily.  30 tablet  11  . levothyroxine (SYNTHROID, LEVOTHROID) 50 MCG tablet Take 50 mcg by mouth daily before breakfast.      . metoprolol (LOPRESSOR) 50 MG tablet Take 1 tablet (50 mg total) by mouth 2 (two) times daily.  60 tablet  11  . Rivaroxaban (XARELTO) 20 MG TABS tablet Take 1 tablet (20 mg total) by mouth daily with supper.  30 tablet  11  . vitamin C (ASCORBIC ACID) 500 MG tablet Take 1,000 mg by mouth daily.        No current facility-administered medications for this visit.    Allergies  Allergen Reactions  . Diltiazem Hcl     IV was OK, oral med caused nausea and possibly diarrhea.  . Sulfonamide Derivatives     Past Medical History  Diagnosis  Date  . Diverticulosis   . Internal hemorrhoids   . Anemia   . Asthma   . HLD (hyperlipidemia)   . Hypothyroidism   . GERD (gastroesophageal reflux disease)     Past Surgical History  Procedure Laterality Date  . Cesarean section  02/07/70  . Abdominal hysterectomy  05/28/81    oavarian cyst and appendix removed  . Tubal ligation    . Tonsillectomy and adenoidectomy    . Breast lumpectomy Left   . Birth mark surgery  2/81, 1976  . Cataract extraction, bilateral    . Knee arthroscopy Left 01/23/03  . Laparoscopy  1979  . Dilation and curettage of uterus  03/20/80    History  Smoking status  . Never Smoker   Smokeless tobacco  . Never Used    History  Alcohol Use  . Yes    Family History  Problem Relation Age of Onset  . Skin cancer Father   . Heart disease Mother   . Colon cancer Neg Hx     Review of Systems: The review of systems is per the HPI.  All other systems were reviewed and are negative.  Physical Exam: BP 130/70  Pulse 99  Ht 5' 2.5" (1.588 m)  Wt 197 lb 1.9 oz (89.413 kg)  BMI 35.46  kg/m2 Patient is very pleasant and in no acute distress. Very anxious. Very talkative. Skin is warm and dry. Color is normal.  HEENT is unremarkable. Normocephalic/atraumatic. PERRL. Sclera are nonicteric. Neck is supple. No masses. No JVD. Lungs are clear. Cardiac exam shows an irregular rhythm. Rate is fair. Abdomen is soft. Extremities are without edema. Gait and ROM are intact. No gross neurologic deficits noted.  LABORATORY DATA: EKG today shows atrial fib with a rate of 102 - fair control.   Lab Results  Component Value Date   WBC 6.1 02/10/2013   HGB 13.4 02/10/2013   HCT 39.4 02/10/2013   PLT 271 02/10/2013   GLUCOSE 92 02/10/2013   CHOL 177 02/10/2013   TRIG 95 02/10/2013   HDL 65 02/10/2013   LDLCALC 93 02/10/2013   ALT 56* 02/08/2013   AST 63* 02/08/2013   NA 139 02/10/2013   K 4.1 02/10/2013   CL 106 02/10/2013   CREATININE 0.69 02/10/2013   BUN 9 02/10/2013    CO2 25 02/10/2013   TSH 4.157 02/09/2013   INR 1.10 02/09/2013   HGBA1C 6.0* 02/09/2013  '  Echo Study Conclusions  - Left ventricle: The cavity size was normal. Wall thickness was normal. Systolic function was normal. The estimated ejection fraction was in the range of 55% to 60%. Wall motion was normal; there were no regional wall motion abnormalities. - Mitral valve: Mild regurgitation. - Right atrium: The atrium was mildly dilated.   Assessment / Plan:  1. Atrial fib - to manage with rate control and anticoagulation with Xarelto - plan for outpatient cardioversion after sufficient period of anticoagulation - no change in medicines today.  2. Abnormal CXR - will repeat with 2 view study  See her back in November as planned - probably cardioversion to be scheduled at that time.   Patient is agreeable to this plan and will call if any problems develop in the interim.   Rosalio Macadamia, RN, ANP-C [facility] [address] [place], [place]  [address]

## 2013-03-03 ENCOUNTER — Telehealth: Payer: Self-pay | Admitting: Nurse Practitioner

## 2013-03-03 NOTE — Telephone Encounter (Signed)
New Problem:  Pt states she is calling Danielle back to get her test results. Pt also states it is ok to leave a voice mail if she does not answer.

## 2013-03-10 ENCOUNTER — Other Ambulatory Visit: Payer: Self-pay

## 2013-03-10 ENCOUNTER — Encounter: Payer: Self-pay | Admitting: Cardiovascular Disease

## 2013-03-10 ENCOUNTER — Ambulatory Visit (INDEPENDENT_AMBULATORY_CARE_PROVIDER_SITE_OTHER): Payer: Medicare Other | Admitting: Cardiovascular Disease

## 2013-03-10 VITALS — BP 131/82 | HR 97 | Wt 199.0 lb

## 2013-03-10 DIAGNOSIS — I4891 Unspecified atrial fibrillation: Secondary | ICD-10-CM

## 2013-03-10 NOTE — Progress Notes (Signed)
Janice David Date of Birth: [birth year] Medical Record #[id number]  History of Present Illness: Janice David is seen back today for a post hospital visit. Seen for Dr. Elease Hashimoto. She has no known history of CAD. She has GERD, HLD and hypothyroidism.  Earlier this month she presented to the GI office for scheduled follow up - noted to have an irregular heart beat - EKG showed atrial fib with RVR - sent to the hospital and was admitted. TSH was normal. She was managed with rate control with plans for outpatient DCCV. She did not tolerate Cardizem. LFTs were up slightly and she had an ultrasound - no critical abnormalities noted. Started on Xarelto.   Comes back today. Here alone. Has lots of questions - has been on MyChart - saw her CXR - says never told about her CXR. Little fatigue and little short of breath. No real palpitations. No chest pain. Asking about a support group. Asking about her GFR. Tolerating her medicines ok.   Nov. 6, 2014:   Janice David is doing well.  She was found to have AF with RVR.  She is tolerating her xarelto.     Current Outpatient Prescriptions  Medication Sig Dispense Refill  . B Complex-C-Folic Acid (STRESS B COMPLEX PO) Take 1 tablet by mouth daily.      . Cholecalciferol (VITAMIN D) 1000 UNITS capsule Take 2,000 Units by mouth daily.      . digoxin (LANOXIN) 0.125 MG tablet Take 1 tablet (0.125 mg total) by mouth daily.  30 tablet  11  . levothyroxine (SYNTHROID, LEVOTHROID) 50 MCG tablet Take 50 mcg by mouth daily before breakfast.      . metoprolol (LOPRESSOR) 50 MG tablet Take 1 tablet (50 mg total) by mouth 2 (two) times daily.  60 tablet  11  . Rivaroxaban (XARELTO) 20 MG TABS tablet Take 1 tablet (20 mg total) by mouth daily with supper.  30 tablet  11  . vitamin C (ASCORBIC ACID) 500 MG tablet Take 1,000 mg by mouth daily.        No current facility-administered medications for this visit.    Allergies  Allergen Reactions  . Diltiazem Hcl     IV was OK,  oral med caused nausea and possibly diarrhea.  . Sulfonamide Derivatives     Past Medical History  Diagnosis Date  . Diverticulosis   . Internal hemorrhoids   . Anemia   . Asthma   . HLD (hyperlipidemia)   . Hypothyroidism   . GERD (gastroesophageal reflux disease)     Past Surgical History  Procedure Laterality Date  . Cesarean section  02/07/70  . Abdominal hysterectomy  05/28/81    oavarian cyst and appendix removed  . Tubal ligation    . Tonsillectomy and adenoidectomy    . Breast lumpectomy Left   . Birth mark surgery  2/81, 1976  . Cataract extraction, bilateral    . Knee arthroscopy Left 01/23/03  . Laparoscopy  1979  . Dilation and curettage of uterus  03/20/80    History  Smoking status  . Never Smoker   Smokeless tobacco  . Never Used    History  Alcohol Use  . Yes    Family History  Problem Relation Age of Onset  . Skin cancer Father   . Heart disease Mother   . Colon cancer Neg Hx     Review of Systems: The review of systems is per the HPI.  All other systems were reviewed  and are negative.  Physical Exam: BP 131/82  Pulse 97  Wt 199 lb (90.266 kg) Patient is very pleasant and in no acute distress. Very anxious. Very talkative. Skin is warm and dry. Color is normal.  HEENT is unremarkable. Normocephalic/atraumatic. PERRL. Sclera are nonicteric. Neck is supple. No masses. No JVD. Lungs are clear. Cardiac exam shows an irregular rhythm. Rate is fair. Abdomen is soft. Extremities are without edema. Gait and ROM are intact. No gross neurologic deficits noted.  LABORATORY DATA: EKG today shows atrial fib with a rate of 102 - fair control.   Lab Results  Component Value Date   WBC 6.1 02/10/2013   HGB 13.4 02/10/2013   HCT 39.4 02/10/2013   PLT 271 02/10/2013   GLUCOSE 92 02/10/2013   CHOL 177 02/10/2013   TRIG 95 02/10/2013   HDL 65 02/10/2013   LDLCALC 93 02/10/2013   ALT 56* 02/08/2013   AST 63* 02/08/2013   NA 139 02/10/2013   K 4.1 02/10/2013   CL  106 02/10/2013   CREATININE 0.69 02/10/2013   BUN 9 02/10/2013   CO2 25 02/10/2013   TSH 4.157 02/09/2013   INR 1.10 02/09/2013   HGBA1C 6.0* 02/09/2013  '  Echo Study Conclusions  - Left ventricle: The cavity size was normal. Wall thickness was normal. Systolic function was normal. The estimated ejection fraction was in the range of 55% to 60%. Wall motion was normal; there were no regional wall motion abnormalities. - Mitral valve: Mild regurgitation. - Right atrium: The atrium was mildly dilated.   Assessment / Plan:

## 2013-03-10 NOTE — Assessment & Plan Note (Signed)
She is still in A-fib.  She has not missed any doses of Xarelto.

## 2013-03-10 NOTE — Patient Instructions (Signed)
Your physician recommends that you schedule a follow-up appointment in: 3 months  Call when ready to schedule cardioversion.

## 2013-03-15 ENCOUNTER — Telehealth: Payer: Self-pay | Admitting: Cardiovascular Disease

## 2013-03-15 DIAGNOSIS — Z01810 Encounter for preprocedural cardiovascular examination: Secondary | ICD-10-CM

## 2013-03-15 DIAGNOSIS — I4891 Unspecified atrial fibrillation: Secondary | ICD-10-CM

## 2013-03-15 NOTE — Telephone Encounter (Signed)
New problem   Pt calling to schedule cardioversion/// please call back

## 2013-03-16 ENCOUNTER — Encounter: Payer: Self-pay | Admitting: *Deleted

## 2013-03-16 NOTE — Telephone Encounter (Addendum)
Pt request 03/25/13 for the cardioversion @11  am/// 9am arrival. PT TO COME FOR LABS SEE LETTER

## 2013-03-22 ENCOUNTER — Other Ambulatory Visit (INDEPENDENT_AMBULATORY_CARE_PROVIDER_SITE_OTHER): Payer: Medicare Other

## 2013-03-22 ENCOUNTER — Other Ambulatory Visit: Payer: Self-pay | Admitting: Cardiovascular Disease

## 2013-03-22 DIAGNOSIS — I4891 Unspecified atrial fibrillation: Secondary | ICD-10-CM

## 2013-03-22 DIAGNOSIS — Z01810 Encounter for preprocedural cardiovascular examination: Secondary | ICD-10-CM

## 2013-03-22 LAB — CBC
Hemoglobin: 14.3 g/dL (ref 12.0–15.0)
MCHC: 33.6 g/dL (ref 30.0–36.0)
MCV: 94.2 fl (ref 78.0–100.0)
RDW: 13.3 % (ref 11.5–14.6)
WBC: 8.2 10*3/uL (ref 4.5–10.5)

## 2013-03-22 LAB — BASIC METABOLIC PANEL
BUN: 13 mg/dL (ref 6–23)
Creatinine, Ser: 1 mg/dL (ref 0.4–1.2)
GFR: 61.56 mL/min (ref >60.00)
Glucose, Bld: 95 mg/dL (ref 70–99)
Potassium: 4.3 mEq/L (ref 3.5–5.1)

## 2013-03-25 ENCOUNTER — Encounter (HOSPITAL_COMMUNITY): Payer: Self-pay | Admitting: *Deleted

## 2013-03-25 ENCOUNTER — Encounter (HOSPITAL_COMMUNITY): Payer: Medicare Other | Admitting: Certified Registered Nurse Anesthetist

## 2013-03-25 ENCOUNTER — Encounter (HOSPITAL_COMMUNITY)
Admission: RE | Disposition: A | Discharge status: Home / Self Care | Payer: Self-pay | Source: Ambulatory Visit | Attending: Cardiology

## 2013-03-25 ENCOUNTER — Ambulatory Visit (HOSPITAL_COMMUNITY): Payer: Medicare Other | Admitting: Certified Registered Nurse Anesthetist

## 2013-03-25 ENCOUNTER — Ambulatory Visit (HOSPITAL_COMMUNITY)
Admission: RE | Admit: 2013-03-25 | Discharge: 2013-03-25 | Disposition: A | Discharge status: Home / Self Care | Payer: Medicare Other | Source: Ambulatory Visit | Attending: Cardiology | Admitting: Cardiology

## 2013-03-25 DIAGNOSIS — I4891 Unspecified atrial fibrillation: Secondary | ICD-10-CM | POA: Insufficient documentation

## 2013-03-25 HISTORY — PX: CARDIOVERSION: SHX1299

## 2013-03-25 SURGERY — CARDIOVERSION
Anesthesia: General

## 2013-03-25 MED ORDER — LIDOCAINE HCL (CARDIAC) 20 MG/ML IV SOLN
INTRAVENOUS | Status: DC | PRN
  Administered 2013-03-25: 40 mg via INTRAVENOUS

## 2013-03-25 MED ORDER — SODIUM CHLORIDE 0.9 % IV SOLN
INTRAVENOUS | Status: DC | PRN
  Administered 2013-03-25: 11:00:00 via INTRAVENOUS

## 2013-03-25 MED ORDER — PROPOFOL 10 MG/ML IV BOLUS
INTRAVENOUS | Status: DC | PRN
  Administered 2013-03-25: 60 mg via INTRAVENOUS

## 2013-03-25 NOTE — CV Procedure (Signed)
Electrical Cardioversion Procedure Note Janice David [id number] 11-Feb-1941  Procedure: Electrical Cardioversion Indications:  Atrial Fibrillation  Procedure Details Consent: Risks of procedure as well as the alternatives and risks of each were explained to the (patient/caregiver).  Consent for procedure obtained. Time Out: Verified patient identification, verified procedure, site/side was marked, verified correct patient position, special equipment/implants available, medications/allergies/relevent history reviewed, required imaging and test results available.  Performed  Patient placed on cardiac monitor, pulse oximetry, supplemental oxygen as necessary.  Sedation given: propofol 60mg  Pacer pads placed anterior and posterior chest.  Cardioverted 1 time(s).  Cardioverted at 120J.  Evaluation Findings: Post procedure EKG shows: NSR Complications: None Patient did tolerate procedure well.   Cassell Clement 03/25/2013, 11:19 AM

## 2013-03-25 NOTE — Transfer of Care (Signed)
Immediate Anesthesia Transfer of Care Note  Patient: Janice David  Procedure(s) Performed: Procedure(s): CARDIOVERSION (N/A)  Patient Location: Endoscopy Unit  Anesthesia Type:General  Level of Consciousness: awake, alert  and oriented  Airway & Oxygen Therapy: Patient Spontanous Breathing and Patient connected to nasal cannula oxygen  Post-op Assessment: Report given to PACU RN, Post -op Vital signs reviewed and stable and Patient moving all extremities  Post vital signs: Reviewed and stable  Complications: No apparent anesthesia complications

## 2013-03-25 NOTE — Anesthesia Preprocedure Evaluation (Addendum)
Anesthesia Evaluation  Patient identified by MRN, date of birth, ID band Patient awake    Reviewed: Allergy & Precautions, H&P , NPO status , Patient's Chart, lab work & pertinent test results  Airway Mallampati: II TM Distance: >3 FB Neck ROM: Full    Dental  (+) Teeth Intact and Dental Advisory Given   Pulmonary          Cardiovascular + dysrhythmias Atrial Fibrillation     Neuro/Psych    GI/Hepatic GERD-  Poorly Controlled,  Endo/Other  Hypothyroidism   Renal/GU      Musculoskeletal   Abdominal   Peds  Hematology   Anesthesia Other Findings   Reproductive/Obstetrics                           Anesthesia Physical Anesthesia Plan  ASA: III  Anesthesia Plan: General   Post-op Pain Management:    Induction: Intravenous  Airway Management Planned:   Additional Equipment:   Intra-op Plan:   Post-operative Plan: Extubation in OR  Informed Consent: I have reviewed the patients History and Physical, chart, labs and discussed the procedure including the risks, benefits and alternatives for the proposed anesthesia with the patient or authorized representative who has indicated his/her understanding and acceptance.   Dental advisory given  Plan Discussed with: CRNA, Anesthesiologist and Surgeon  Anesthesia Plan Comments:         Anesthesia Quick Evaluation

## 2013-03-25 NOTE — Preoperative (Signed)
Beta Blockers   Reason not to administer Beta Blockers:Metoprolol taken at 2000 hrs on 03/24/2013

## 2013-03-25 NOTE — Anesthesia Postprocedure Evaluation (Signed)
  Anesthesia Post-op Note  Patient: Janice David  Procedure(s) Performed: Procedure(s): CARDIOVERSION (N/A)  Patient Location: PACU  Anesthesia Type:MAC  Level of Consciousness: awake  Airway and Oxygen Therapy: Patient Spontanous Breathing  Post-op Pain: mild  Post-op Assessment: Post-op Vital signs reviewed  Post-op Vital Signs: Reviewed  Complications: No apparent anesthesia complications

## 2013-03-25 NOTE — H&P (Signed)
This patient was found to have atrial fibrillation when she presented for a GI visit last month. She has been on adequate dose of xarelto. Presents now for elective DCCV. EF normal.

## 2013-03-25 NOTE — Anesthesia Postprocedure Evaluation (Signed)
  Anesthesia Post-op Note  Patient: Janice David November  Procedure(s) Performed: Procedure(s): CARDIOVERSION (N/A)  Patient Location: Short Stay  Anesthesia Type:General  Level of Consciousness: awake  Airway and Oxygen Therapy: Patient Spontanous Breathing  Post-op Pain: mild  Post-op Assessment: Post-op Vital signs reviewed  Post-op Vital Signs: Reviewed  Complications: No apparent anesthesia complications

## 2013-03-28 ENCOUNTER — Encounter (HOSPITAL_COMMUNITY): Payer: Self-pay | Admitting: Cardiology

## 2013-04-12 ENCOUNTER — Encounter: Payer: Self-pay | Admitting: Cardiovascular Disease

## 2013-04-12 ENCOUNTER — Ambulatory Visit (INDEPENDENT_AMBULATORY_CARE_PROVIDER_SITE_OTHER): Payer: Medicare Other | Admitting: Cardiovascular Disease

## 2013-04-12 ENCOUNTER — Telehealth: Payer: Self-pay | Admitting: Cardiovascular Disease

## 2013-04-12 VITALS — BP 114/84 | HR 118 | Resp 12 | Ht 62.5 in | Wt 196.0 lb

## 2013-04-12 DIAGNOSIS — I4891 Unspecified atrial fibrillation: Secondary | ICD-10-CM

## 2013-04-12 NOTE — Progress Notes (Signed)
History of Present Illness: 72 yo female with history of atrial fibrillation, GERD, HLD, hypothyroidism who is here today as a DOD add on for atrial fibrillation with RVR. She is followed by Dr. Elease Hashimoto. Admitted to [facility] October 2014 with atrial fib with RVR. She was started on Xarelto and sent home with rate control. She was cardioverted to sinus on 03/25/13. Since then she has had frequent palpitations, feeling fatigued. She came to the office for a nurse visit today for EKG. She was found to be in atrial fibrillation with RVR. She reports fatigue but no dizziness. She has also had some oozing from her gums after brushing her teeth. No dark stools. No vomiting. No real palpitations. No chest pain. She thinks the metoprolol makes her feel weak.   Last Lipid Profile:Lipid Panel     Component Value Date/Time   CHOL 177 02/10/2013 0400   TRIG 95 02/10/2013 0400   HDL 65 02/10/2013 0400   CHOLHDL 2.7 02/10/2013 0400   VLDL 19 02/10/2013 0400   LDLCALC 93 02/10/2013 0400    Past Medical History  Diagnosis Date  . Diverticulosis   . Internal hemorrhoids   . Anemia   . Asthma   . HLD (hyperlipidemia)   . Hypothyroidism   . GERD (gastroesophageal reflux disease)   . Atrial fibrillation     Past Surgical History  Procedure Laterality Date  . Cesarean section  02/07/70  . Abdominal hysterectomy  05/28/81    oavarian cyst and appendix removed  . Tubal ligation    . Tonsillectomy and adenoidectomy    . Breast lumpectomy Left   . Birth mark surgery  2/81, 1976  . Cataract extraction, bilateral    . Knee arthroscopy Left 01/23/03  . Laparoscopy  1979  . Dilation and curettage of uterus  03/20/80  . Cardioversion N/A 03/25/2013    Procedure: CARDIOVERSION;  Surgeon: Cassell Clement, MD;  Location: [facility] ENDOSCOPY;  Service: Cardiovascular;  Laterality: N/A;    Current Outpatient Prescriptions  Medication Sig Dispense Refill  . B Complex-C-Folic Acid (STRESS B COMPLEX PO) Take 1 tablet by  mouth daily.      . Cholecalciferol (VITAMIN D) 1000 UNITS capsule Take 2,000 Units by mouth daily.      . digoxin (LANOXIN) 0.125 MG tablet Take 1 tablet (0.125 mg total) by mouth daily.  30 tablet  11  . levothyroxine (SYNTHROID, LEVOTHROID) 50 MCG tablet Take 50 mcg by mouth daily before breakfast.      . metoprolol (LOPRESSOR) 50 MG tablet Take 1 tablet (50 mg total) by mouth 2 (two) times daily.  60 tablet  11  . Rivaroxaban (XARELTO) 20 MG TABS tablet Take 1 tablet (20 mg total) by mouth daily with supper.  30 tablet  11  . vitamin C (ASCORBIC ACID) 500 MG tablet Take 1,000 mg by mouth daily.        No current facility-administered medications for this visit.    Allergies  Allergen Reactions  . Diltiazem Hcl     IV was OK, oral med caused nausea and possibly diarrhea.  . Sulfonamide Derivatives     History   Social History  . Marital Status: Divorced    Spouse Name: N/A    Number of Children: 2  . Years of Education: N/A   Occupational History  . retired    Social History Main Topics  . Smoking status: Never Smoker   . Smokeless tobacco: Never Used  . Alcohol Use:  Yes     Comment: occassionally  . Drug Use: No  . Sexual Activity: Not on file   Other Topics Concern  . Not on file   Social History Narrative  . No narrative on file    Family History  Problem Relation Age of Onset  . Skin cancer Father   . Heart disease Mother   . Colon cancer Neg Hx     Review of Systems:  As stated in the HPI and otherwise negative.   BP 114/84  Pulse 118  Ht 5' 2.5" (1.588 m)  Wt 196 lb (88.905 kg)  BMI 35.26 kg/m2  Physical Examination: General: Well developed, well nourished, NAD HEENT: OP clear, mucus membranes moist SKIN: warm, dry. No rashes. Neuro: No focal deficits Musculoskeletal: Muscle strength 5/5 all ext Psychiatric: Mood and affect normal Neck: No JVD, no carotid bruits, no thyromegaly, no lymphadenopathy. Lungs:Clear bilaterally, no wheezes,  rhonci, crackles Cardiovascular: Irregular irregular No murmurs, gallops or rubs. Abdomen:Soft. Bowel sounds present. Non-tender.  Extremities: No lower extremity edema. Pulses are 2 + in the bilateral DP/PT.  EKG: Atrial fibrillation, rate 118 bpm.   Echo 02/10/13: Left ventricle: The cavity size was normal. Wall thickness was normal. Systolic function was normal. The estimated ejection fraction was in the range of 55% to 60%. Wall motion was normal; there were no regional wall motion abnormalities. - Mitral valve: Mild regurgitation. - Right atrium: The atrium was mildly dilated.  Assessment and Plan:   1. Atrial fibrillation, paroxysmal: Walked in to office today. She has been followed by Dr. Elease Hashimoto. She was cardioverted 03/25/13 to sinus but now back in atrial fib. She refuses to consider repeat DCCV. She is anti-coagulated on Xarelto.As above, some minor gum bleeding after brushing her teeth. I do not think this is a reason at this time to stop her Xarelto and change to another agent. She is encouraged to use caution when brushing her teeth. Her heart rate today is 118 bpm. She does not feel poorly. Her BP is stable. No indication for hospitalization. I have discussed rate control strategies. She had a reaction to Diltiazem while at [facility], reported flushing. She has been on metoprolol 50 mg po BID and she thinks this makes her feel poorly but it is most likely that her atrial fibrillation is contributing to her fatigue. I would like to increase the metoprolol but she refuses. During a 30 minute discussion today, we went back and forth about the plan and ultimately, she did not want to change anything.  I am going to refer her to EP to discuss ablation and anti-arrythmic therapy since she seems to poorly tolerate all medical therapy tried thus far. Can also make further plans in regards to best anti-coagulant. She is asked to go to the ED if her clinical status changes or she has worsened gum  bleeding.  Her long term follow up will be with Dr. Elease Hashimoto.

## 2013-04-12 NOTE — Telephone Encounter (Signed)
Pt called because she states is back on A-Fib. Pt had cardioversion on 03/25/13. Pt is sob with mild activity. Yesterday she was cold and clammy. Pt is on Xarelto 20 mg once a day. Last night her gums were bleeding bright red blood, she rinsed her mouth several times she was afraid that the bleeding  would not stop. It went away soon after.  This AM pt  felt like her right ear was gargling like it was fluid in it . The sound has gone away now. Pt has an appointment at 2:00 PM for an EKG

## 2013-04-12 NOTE — Patient Instructions (Signed)
You have been referred to Dr. Graciela Husbands or Dr. Johney Frame.  Please schedule new pt appt for pt

## 2013-04-12 NOTE — Telephone Encounter (Signed)
New message    Pt says she is back in afib; and pt is on xarelto and her gums were bleeding last night.  She only want to see a doctor.  I offered her an appt with the PA/NP.

## 2013-04-12 NOTE — Telephone Encounter (Signed)
Pt came in for an EKG. EKG done. Dr. Clifton James will see pt in his office the afternoon.

## 2013-04-18 ENCOUNTER — Encounter: Payer: Self-pay | Admitting: Cardiovascular Disease

## 2013-05-18 ENCOUNTER — Institutional Professional Consult (permissible substitution): Payer: Medicare Other | Admitting: Internal Medicine

## 2013-06-15 ENCOUNTER — Ambulatory Visit (INDEPENDENT_AMBULATORY_CARE_PROVIDER_SITE_OTHER): Payer: Medicare HMO | Admitting: Cardiovascular Disease

## 2013-06-15 ENCOUNTER — Encounter: Payer: Self-pay | Admitting: Cardiovascular Disease

## 2013-06-15 ENCOUNTER — Encounter: Payer: Self-pay | Admitting: *Deleted

## 2013-06-15 VITALS — BP 114/84 | HR 80 | Ht 62.5 in | Wt 196.8 lb

## 2013-06-15 DIAGNOSIS — I4891 Unspecified atrial fibrillation: Secondary | ICD-10-CM

## 2013-06-15 MED ORDER — DIGOXIN 125 MCG PO TABS: 0.1250 mg | ORAL_TABLET | Freq: Every day | ORAL | Status: DC

## 2013-06-15 MED ORDER — METOPROLOL TARTRATE 50 MG PO TABS: ORAL_TABLET | ORAL | Status: DC

## 2013-06-15 NOTE — Patient Instructions (Signed)
Your physician recommends that you schedule a follow-up appointment in: 3 MONTHS WITH DR Acie Fredrickson  INCREASE METOPROLOL TO 75 MG (ONE AND ONE HALF TABLETS) TWICE DAILY  Your physician has requested that you have a lexiscan myoview. For further information please visit [email or web address]. Please follow instruction sheet, as given.

## 2013-06-15 NOTE — Assessment & Plan Note (Addendum)
She was cardioverted last month.  She has gone back into A-Fib.  Her heart rate  is still a little fast . I think that finally convinced her to increase metoprolol to 75 mg twice a day. Dr. Angelena Form suggested that several months ago but she did not want to increase her dose.   I would like to try Flecainide.  We will need to get a Lexiscan myoview study prior to starting the Flecainide.    I anticpate starting Flecainide 100 BId assuming the myoview is negative.  She is scheduled to see Dr. Rayann Heman soon.  I will see her in 3 months.

## 2013-06-15 NOTE — Progress Notes (Signed)
Janice David Date of Birth: 1940-07-13 Medical Record #[id number]  History of Present Illness: Janice David is seen back today for a post hospital. She has no known history of CAD. She has GERD, HLD and hypothyroidism.  Earlier this month she presented to the GI office for scheduled follow up - noted to have an irregular heart beat - EKG showed atrial fib with RVR - sent to the hospital and was admitted. TSH was normal. She was managed with rate control with plans for outpatient DCCV. She did not tolerate Cardizem. LFTs were up slightly and she had an ultrasound - no critical abnormalities noted. Started on Xarelto.   Comes back today. Here alone. Has lots of questions - has been on MyChart - saw her CXR - says never told about her CXR. Little fatigue and little short of breath. No real palpitations. No chest pain. Asking about a support group. Asking about her GFR. Tolerating her medicines ok.   Nov. 6, 2014:   Janice David is doing well.  She was found to have AF with RVR.  She is tolerating her xarelto.    Feb. 11, 2015:  Janice David was cardioverted in December.  Janice David Kitchen Unfortunately, she has already gone back into atrial fibrillation after about 1 week.  . She has been on metoprolol but it tends to make her tired. She also did not tolerate diltiazem very well.  She has had some minor gum bleeding but otherwise seems to be tolerating the Xarelto.   She continues to have fatigue and DOE with climbing stairs.  She has been referred to Dr. Rayann Heman but missed the appt. Because of a family emergency.   She already has another apt. Rescheduled for Dr. Rayann Heman.     Current Outpatient Prescriptions  Medication Sig Dispense Refill  . alum & mag hydroxide-simeth (GELUSIL) [id number] MG suspension Chew by mouth every 6 (six) hours as needed for indigestion or heartburn.      . B Complex-C-Folic Acid (STRESS B COMPLEX PO) Take 1 tablet by mouth daily.      . digoxin (LANOXIN) 0.125 MG tablet Take 1 tablet (0.125  mg total) by mouth daily.  30 tablet  11  . levothyroxine (SYNTHROID, LEVOTHROID) 50 MCG tablet Take 50 mcg by mouth daily before breakfast.      . metoprolol (LOPRESSOR) 50 MG tablet Take 1 tablet (50 mg total) by mouth 2 (two) times daily.  60 tablet  11  . Rivaroxaban (XARELTO) 20 MG TABS tablet Take 1 tablet (20 mg total) by mouth daily with supper.  30 tablet  11  . vitamin C (ASCORBIC ACID) 500 MG tablet Take 1,000 mg by mouth daily.        No current facility-administered medications for this visit.    Allergies  Allergen Reactions  . Diltiazem Hcl     IV was OK, oral med caused nausea and possibly diarrhea.  . Sulfonamide Derivatives     Past Medical History  Diagnosis Date  . Diverticulosis   . Internal hemorrhoids   . Anemia   . Asthma   . HLD (hyperlipidemia)   . Hypothyroidism   . GERD (gastroesophageal reflux disease)   . Atrial fibrillation     Past Surgical History  Procedure Laterality Date  . Cesarean section  02/07/70  . Abdominal hysterectomy  05/28/81    oavarian cyst and appendix removed  . Tubal ligation    . Tonsillectomy and adenoidectomy    . Breast lumpectomy Left   .  Birth mark surgery  2/81, 1976  . Cataract extraction, bilateral    . Knee arthroscopy Left 01/23/03  . Laparoscopy  1979  . Dilation and curettage of uterus  03/20/80  . Cardioversion N/A 03/25/2013    Procedure: CARDIOVERSION;  Surgeon: Darlin Coco, MD;  Location: [facility] ENDOSCOPY;  Service: Cardiovascular;  Laterality: N/A;    History  Smoking status  . Never Smoker   Smokeless tobacco  . Never Used    History  Alcohol Use  . Yes    Comment: occassionally    Family History  Problem Relation Age of Onset  . Skin cancer Father   . Heart disease Mother   . Colon cancer Neg Hx     Review of Systems: The review of systems is per the HPI.  All other systems were reviewed and are negative.  Physical Exam: BP 114/84  Pulse 80  Ht 5' 2.5" (1.588 m)  Wt 196 lb 12.8  oz (89.268 kg)  BMI 35.40 kg/m2 Patient is very pleasant and in no acute distress. Very anxious. Very talkative. Skin is warm and dry. Color is normal.  HEENT is unremarkable. Normocephalic/atraumatic. PERRL. Sclera are nonicteric. Neck is supple. No masses. No JVD. Lungs are clear. Cardiac exam shows an irregular rhythm. Rate is fair. Abdomen is soft. Extremities are without edema. Gait and ROM are intact. No gross neurologic deficits noted.  LABORATORY DATA: EKG today shows atrial fib with a rate of 102 - fair control.   Lab Results  Component Value Date   WBC 8.2 03/22/2013   HGB 14.3 03/22/2013   HCT 42.5 03/22/2013   PLT 278.0 03/22/2013   GLUCOSE 95 03/22/2013   CHOL 177 02/10/2013   TRIG 95 02/10/2013   HDL 65 02/10/2013   LDLCALC 93 02/10/2013   ALT 56* 02/08/2013   AST 63* 02/08/2013   NA 133* 03/22/2013   K 4.3 03/22/2013   CL 99 03/22/2013   CREATININE 1.0 03/22/2013   BUN 13 03/22/2013   CO2 28 03/22/2013   TSH 4.157 02/09/2013   INR 1.10 02/09/2013   HGBA1C 6.0* 02/09/2013  '  Echo Study Conclusions  - Left ventricle: The cavity size was normal. Wall thickness was normal. Systolic function was normal. The estimated ejection fraction was in the range of 55% to 60%. Wall motion was normal; there were no regional wall motion abnormalities. - Mitral valve: Mild regurgitation. - Right atrium: The atrium was mildly dilated.   Assessment / Plan:

## 2013-06-24 ENCOUNTER — Other Ambulatory Visit: Payer: Self-pay

## 2013-06-24 DIAGNOSIS — I4891 Unspecified atrial fibrillation: Secondary | ICD-10-CM

## 2013-06-24 MED ORDER — DIGOXIN 125 MCG PO TABS: 0.1250 mg | ORAL_TABLET | Freq: Every day | ORAL | Status: DC

## 2013-06-24 MED ORDER — METOPROLOL TARTRATE 50 MG PO TABS: ORAL_TABLET | ORAL | Status: DC

## 2013-06-29 ENCOUNTER — Institutional Professional Consult (permissible substitution): Payer: Medicare Other | Admitting: Internal Medicine

## 2013-07-04 ENCOUNTER — Encounter (HOSPITAL_COMMUNITY): Payer: Self-pay

## 2013-07-08 ENCOUNTER — Telehealth: Payer: Self-pay | Admitting: *Deleted

## 2013-07-08 NOTE — Telephone Encounter (Signed)
Message copied by Jonathon Jordan on Fri Jul 08, 2013  2:40 PM ------      Message from: Vashti Hey D      Created: Fri Jun 24, 2013  8:36 AM      Regarding: MYOVIEW       06/24/13 Patient cancel ------

## 2013-07-08 NOTE — Telephone Encounter (Signed)
Left msg to call back to reschedule the myoview at her convenience/ number provided.

## 2013-07-13 ENCOUNTER — Encounter: Payer: Self-pay | Admitting: Internal Medicine

## 2013-09-09 ENCOUNTER — Encounter: Payer: Self-pay | Admitting: *Deleted

## 2013-09-09 ENCOUNTER — Ambulatory Visit (INDEPENDENT_AMBULATORY_CARE_PROVIDER_SITE_OTHER): Payer: Commercial Managed Care - HMO | Admitting: Internal Medicine

## 2013-09-09 ENCOUNTER — Encounter: Payer: Self-pay | Admitting: Internal Medicine

## 2013-09-09 VITALS — BP 125/90 | HR 97 | Ht 62.0 in | Wt 201.0 lb

## 2013-09-09 DIAGNOSIS — R0602 Shortness of breath: Secondary | ICD-10-CM

## 2013-09-09 DIAGNOSIS — I4891 Unspecified atrial fibrillation: Secondary | ICD-10-CM

## 2013-09-09 DIAGNOSIS — R0789 Other chest pain: Secondary | ICD-10-CM

## 2013-09-09 NOTE — Patient Instructions (Signed)
Your physician recommends that you schedule a follow-up appointment in: AS NEEDED WITH DR Rayann Heman  Your physician recommends that you schedule a follow-up appointment in: [place] has requested that you have a lexiscan myoview. For further information please visit [email or web address]. Please follow instruction sheet, as given.

## 2013-09-19 ENCOUNTER — Ambulatory Visit: Payer: Self-pay | Admitting: Cardiovascular Disease

## 2013-09-20 DIAGNOSIS — R0789 Other chest pain: Secondary | ICD-10-CM | POA: Insufficient documentation

## 2013-09-20 DIAGNOSIS — R0602 Shortness of breath: Secondary | ICD-10-CM | POA: Insufficient documentation

## 2013-09-20 NOTE — Progress Notes (Signed)
Primary Care Physician: Gerrit Heck, MD Referring Physician:  Dr Adriana Mccallum is a 73 y.o. female with a h/o atrial fibrillation who presents today for EP consultation.  She recently presented to GI and was found to have an irregular heart beat.  EKG at that time revealed Afib with RVR.  She was admitted to [facility].  She was managed with rate control with plans for outpatient DCCV. She did not tolerate Cardizem. She was started on Xarelto.  She underwent cardioversion in February but returned to afib within a week.  She does not recall significant improvements when in sinus rhythm.   She has fatigue and DOE with climbing stairs.  Recently, she has begun having exertional chest tightness and SOB.  She remains active however.  Today, she denies symptoms of shortness of breath at rest, orthopnea, PND, lower extremity edema, dizziness, presyncope, syncope, or neurologic sequela. The patient is tolerating medications without difficulties and is otherwise without complaint today.   Past Medical History  Diagnosis Date  . Diverticulosis   . Internal hemorrhoids   . Anemia   . Asthma   . HLD (hyperlipidemia)   . Hypothyroidism   . GERD (gastroesophageal reflux disease)   . Atrial fibrillation    Past Surgical History  Procedure Laterality Date  . Cesarean section  02/07/70  . Abdominal hysterectomy  05/28/81    oavarian cyst and appendix removed  . Tubal ligation    . Tonsillectomy and adenoidectomy    . Breast lumpectomy Left   . Birth mark surgery  2/81, 1976  . Cataract extraction, bilateral    . Knee arthroscopy Left 01/23/03  . Laparoscopy  1979  . Dilation and curettage of uterus  03/20/80  . Cardioversion N/A 03/25/2013    Procedure: CARDIOVERSION;  Surgeon: Darlin Coco, MD;  Location: [facility] ENDOSCOPY;  Service: Cardiovascular;  Laterality: N/A;    Current Outpatient Prescriptions  Medication Sig Dispense Refill  . alum & mag hydroxide-simeth (GELUSIL)  [id number] MG suspension Chew by mouth every 6 (six) hours as needed for indigestion or heartburn.      . B Complex-C-Folic Acid (STRESS B COMPLEX PO) Take 1 tablet by mouth daily.      . digoxin (LANOXIN) 0.125 MG tablet Take 1 tablet (0.125 mg total) by mouth daily.  90 tablet  1  . levothyroxine (SYNTHROID, LEVOTHROID) 50 MCG tablet Take 50 mcg by mouth daily before breakfast.      . metoprolol (LOPRESSOR) 50 MG tablet TAKE ONE AND ONE HALF TABLETS TWICE DAILY  135 tablet  1  . Rivaroxaban (XARELTO) 20 MG TABS tablet Take 1 tablet (20 mg total) by mouth daily with supper.  30 tablet  11  . vitamin C (ASCORBIC ACID) 500 MG tablet Take 1,000 mg by mouth daily.        No current facility-administered medications for this visit.    Allergies  Allergen Reactions  . Diltiazem Hcl     IV was OK, oral med caused nausea and possibly diarrhea.  . Sulfonamide Derivatives     History   Social History  . Marital Status: Divorced    Spouse Name: N/A    Number of Children: 2  . Years of Education: N/A   Occupational History  . retired    Social History Main Topics  . Smoking status: Never Smoker   . Smokeless tobacco: Never Used  . Alcohol Use: Yes     Comment: occassionally  . Drug Use:  No  . Sexual Activity: Not on file   Other Topics Concern  . Not on file   Social History Narrative  . No narrative on file    Family History  Problem Relation Age of Onset  . Skin cancer Father   . Heart disease Mother   . Colon cancer Neg Hx     ROS- All systems are reviewed and negative except as per the HPI above  Physical Exam: Filed Vitals:   09/09/13 1421  BP: 125/90  Pulse: 97  Height: 5\' 2"  (1.575 m)  Weight: 201 lb (91.173 kg)    GEN- The patient is overweight appearing, alert and oriented x 3 today.   Head- normocephalic, atraumatic Eyes-  Sclera clear, conjunctiva pink Ears- hearing intact Oropharynx- clear Neck- supple  Lungs- Clear to ausculation bilaterally,  normal work of breathing Heart- irregular rate and rhythm,  GI- soft, NT, ND, + BS Extremities- no clubbing, cyanosis, or edema MS- no significant deformity or atrophy Skin- no rash or lesion Psych- euthymic mood, full affect Neuro- strength and sensation are intact  Epic records including echo, ekgs, and Dr Lanny Hurst notes are reviewed  Assessment and Plan:  Persistent atrial fibrillation The patient has persistent atrial fibrillation.  She is minimally symptomatic with this and has done reasonably well with rate control.  Therapeutic strategies for afib including rate control and rhythm control were discussed in detail with the patient today.   Guidelines would suggest that AAD therapy would be required prior to ablation.  Given her reduced anticipated success rates with ablation, I think that this is prudent.  We discussed sotalol or tikosyn as options today.  Presently, she is clear that she would prefer to continue her current rate control strategy.  I think that this is quite reasonable. Her chads2vasc score is at least 2.  She should therefore continue anticoagulation long term.  2. Exertional chest tightness and SOB I am concerned that this could be angina.   I will therefore order a myoview for further CV risk stratification.  She should follow-up with Dr Acie Fredrickson for results.  She will follow-up with Dr Acie Fredrickson and I will see as needed going forward.

## 2013-09-23 ENCOUNTER — Telehealth: Payer: Self-pay | Admitting: Cardiovascular Disease

## 2013-09-23 NOTE — Telephone Encounter (Signed)
New message     Having a nuclear stress test Tuesday---pt was looking over instructions and have a question

## 2013-09-23 NOTE — Telephone Encounter (Signed)
Pt called because she states was confused about the instructions given to her 2 months ago to not to take the metoprolol prior lexiscan Myoview  She is scheduled to have this test on 5/26 th in this new  instructions states  that she can take all her medications. Pt was explained that with this test medication can be taken; it is fine to take the betablocker. Pt insisted that she is not sure if she is ready to take this test.now with all she has gone with her and that she read in the internet that many things may happened having this test. Pt also states she would like to have the A-fib out of the way, and Dr Rayann Heman is not doing the ablation now. So pt would like to have an appointment with Dr. Caryl Comes instead.  Pt would like to have the Joiner for now. ( explanation given about the test, but she is insisting to have more information). She would like to have more information about the side effect and what it is for. Pt is aware that this message will be send to Md and nurse for recommendations.

## 2013-09-27 ENCOUNTER — Encounter (HOSPITAL_COMMUNITY): Payer: Self-pay

## 2013-11-16 ENCOUNTER — Telehealth: Payer: Self-pay | Admitting: Internal Medicine

## 2013-11-16 NOTE — Telephone Encounter (Signed)
lmom for patient to call me back.  When she saw Dr Rayann Heman he was worried about her CP and SOB and wanted her to have a stress test and follow up with Dr Acie Fredrickson as her afib seemed to be not so problematic.  He CR and SOB was more worrisome.  She canceled the stress test.  I have asked that she call back to discuss her symptoms as I'm not sure she needs to see Dr Caryl Comes but maybe follow up wit Dr Acie Fredrickson for her symptoms

## 2013-11-16 NOTE — Telephone Encounter (Signed)
New problem    Pt saw Dr Rayann Heman for afib ablation and now pt want to see Dr Caryl Comes. I advise pt that Dr Caryl Comes doesn't see pt's for afib ablation but pt still wanted to change. Pt want to speak to nurse.

## 2013-11-17 NOTE — Telephone Encounter (Addendum)
Called patient back to discuss her symptoms and she is having more problems with afib not so much CP.  She has no history of CAD

## 2013-11-21 NOTE — Telephone Encounter (Signed)
Pt requesting to consult with Dr. Caryl Comes.  Dr. Rayann Heman is aware and ok with pt seeing Dr. Caryl Comes. Given to [facility] to arrange OV.

## 2013-12-02 ENCOUNTER — Telehealth: Payer: Self-pay | Admitting: Internal Medicine

## 2013-12-02 NOTE — Telephone Encounter (Signed)
12-02-13 LMM @ 1216 pm for pt to call back to let us know if she would like a referral to [facility] in [place], [place], or [facility] for her Afib, Dr Rayann Heman doesn't feel she is a candidate for an ablation , so she wants to see Jiles Prows doesn't do ablations and thinks pt may do better being referred to someone who does them and can get a second opinion.

## 2014-01-05 ENCOUNTER — Ambulatory Visit (INDEPENDENT_AMBULATORY_CARE_PROVIDER_SITE_OTHER): Payer: Commercial Managed Care - HMO | Admitting: Internal Medicine

## 2014-01-05 VITALS — BP 132/90 | HR 97 | Ht 62.0 in | Wt 197.0 lb

## 2014-01-05 DIAGNOSIS — I4819 Other persistent atrial fibrillation: Secondary | ICD-10-CM

## 2014-01-05 DIAGNOSIS — I4891 Unspecified atrial fibrillation: Secondary | ICD-10-CM

## 2014-01-05 MED ORDER — METOPROLOL TARTRATE 50 MG PO TABS
100.0000 mg | ORAL_TABLET | Freq: Two times a day (BID) | ORAL | Status: DC
Start: 2014-01-05 — End: 2014-02-16

## 2014-01-05 NOTE — Patient Instructions (Signed)
Your physician has recommended you make the following change in your medication:  1) STOP Digoxin 2) INCREASE Metoprolol to 100 mg twice daily  Your physician has recommended that you have a home sleep study. This test records several body functions during sleep, including: brain activity, eye movement, oxygen and carbon dioxide blood levels, heart rate and rhythm, breathing rate and rhythm, the flow of air through your mouth and nose, snoring, body muscle movements, and chest and belly movement.  We will contact you about setting this up.  Your physician recommends that you schedule a follow-up appointment in: 4-6 weeks with Dr. Caryl Comes.

## 2014-01-05 NOTE — Progress Notes (Signed)
Patient Care Team: Gerrit Heck, MD as PCP - General (Family Medicine)   HPI  Janice David is a 73 y.o. female Seen in followup for atrial fibrillation.  She was noted in the spring 2015 to have atrial fibrillation during GI evaluation. He saw Dr. Greggory Brandy who noted that she had persistent atrial fibrillation with well controlled rates. It was elected to maintain her on anticoagulation and treat her with rate control.  Echocardiogram 10/14 demonstrated normal LV function; left atrial size was normal;  her thromboembolic risk profile is notable for age-28; gender-1  Functional status has been relatively stable    Past Medical History  Diagnosis Date  . Diverticulosis   . Internal hemorrhoids   . Anemia   . Asthma   . HLD (hyperlipidemia)   . Hypothyroidism   . GERD (gastroesophageal reflux disease)   . Atrial fibrillation     Past Surgical History  Procedure Laterality Date  . Cesarean section  02/07/70  . Abdominal hysterectomy  05/28/81    oavarian cyst and appendix removed  . Tubal ligation    . Tonsillectomy and adenoidectomy    . Breast lumpectomy Left   . Birth mark surgery  2/81, 1976  . Cataract extraction, bilateral    . Knee arthroscopy Left 01/23/03  . Laparoscopy  1979  . Dilation and curettage of uterus  03/20/80  . Cardioversion N/A 03/25/2013    Procedure: CARDIOVERSION;  Surgeon: Darlin Coco, MD;  Location: [facility] ENDOSCOPY;  Service: Cardiovascular;  Laterality: N/A;    Current Outpatient Prescriptions  Medication Sig Dispense Refill  . B Complex-C-Folic Acid (STRESS B COMPLEX PO) Take 1 tablet by mouth daily.      . Calcium Carbonate Antacid (TUMS E-X PO) Take 1 tablet by mouth as needed (indigestion).      . cholecalciferol (VITAMIN D) 1000 UNITS tablet Take 1,000 Units by mouth 4 (four) times daily as needed.      . digoxin (LANOXIN) 0.125 MG tablet Take 1 tablet (0.125 mg total) by mouth daily.  90 tablet  1  . levothyroxine  (SYNTHROID, LEVOTHROID) 50 MCG tablet Take 50 mcg by mouth daily before breakfast.      . metoprolol (LOPRESSOR) 50 MG tablet TAKE ONE AND ONE HALF TABLETS TWICE DAILY  135 tablet  1  . Rivaroxaban (XARELTO) 20 MG TABS tablet Take 1 tablet (20 mg total) by mouth daily with supper.  30 tablet  11  . vitamin C (ASCORBIC ACID) 500 MG tablet Take 1,000 mg by mouth daily.        No current facility-administered medications for this visit.    Allergies  Allergen Reactions  . Diltiazem Hcl     IV was OK, oral med caused nausea and possibly diarrhea.  . Sulfonamide Derivatives     Review of Systems negative except from HPI and PMH  Physical Exam BP 132/90  Pulse 97  Ht 5\' 2"  (1.575 m)  Wt 197 lb (89.359 kg)  BMI 36.02 kg/m2 Well developed and well nourished in no acute distress HENT normal E scleral and icterus clear Neck Supple JVP flat; carotids brisk and full Clear to ausculation irregularly irregular   rate and rhythm, no murmurs gallops or rub Soft with active bowel sounds No clubbing cyanosis  Edema Alert and oriented, grossly normal motor and sensory function Skin Warm and Dry    Assessment and  Plan  Atrial fibrillation-persistent with a rapid ventricular response  Exercise intolerance  Sleep-disordered breathing  Hypertension  The patient has persistent atrial fibrillation with a significant increase in heart rate with exertion. This likely contributes to her exercise intolerance. We spent a long time discussing rate versus rhythm control strategies. She is at this juncture reluctant to pursue repeat cardioversion but she is open to thinking about it.  For now we will increase her metoprolol from 50--100 twice a day. We'll discontinue her digoxin.  We will plan to regroup in aobut 4-6 weeks to review   treatment strategies with the anticipation that we would begin flecainide and repeat cardioversion and then pursue ablation if we could demonstrate improvement with   sinus rhythm

## 2014-02-15 ENCOUNTER — Other Ambulatory Visit: Payer: Self-pay | Admitting: *Deleted

## 2014-02-15 MED ORDER — RIVAROXABAN 20 MG PO TABS: 20.0000 mg | ORAL_TABLET | Freq: Every day | ORAL | Status: DC

## 2014-02-16 ENCOUNTER — Encounter: Payer: Self-pay | Admitting: Internal Medicine

## 2014-02-16 ENCOUNTER — Ambulatory Visit (INDEPENDENT_AMBULATORY_CARE_PROVIDER_SITE_OTHER): Payer: Commercial Managed Care - HMO | Admitting: Internal Medicine

## 2014-02-16 ENCOUNTER — Telehealth: Payer: Self-pay | Admitting: Internal Medicine

## 2014-02-16 VITALS — BP 124/78 | HR 89 | Ht 62.0 in | Wt 204.2 lb

## 2014-02-16 DIAGNOSIS — I4819 Other persistent atrial fibrillation: Secondary | ICD-10-CM

## 2014-02-16 DIAGNOSIS — I4891 Unspecified atrial fibrillation: Secondary | ICD-10-CM

## 2014-02-16 DIAGNOSIS — I481 Persistent atrial fibrillation: Secondary | ICD-10-CM

## 2014-02-16 MED ORDER — VERAPAMIL HCL ER 120 MG PO TBCR: 120.0000 mg | EXTENDED_RELEASE_TABLET | Freq: Every day | ORAL | Status: DC

## 2014-02-16 MED ORDER — METOPROLOL TARTRATE 50 MG PO TABS: 50.0000 mg | ORAL_TABLET | Freq: Two times a day (BID) | ORAL | Status: DC

## 2014-02-16 NOTE — Patient Instructions (Addendum)
Your physician has recommended you make the following change in your medication:  1) DECREASE Metoprolol to 50 mg twice a day 2) START Verapamil 120 mg daily -- DO NOT START UNTIL 10/25  Your physician has recommended that you wear a 24 holter monitor -- begin this on 03/08/14. Holter monitors are medical devices that record the heart's electrical activity. Doctors most often use these monitors to diagnose arrhythmias. Arrhythmias are problems with the speed or rhythm of the heartbeat. The monitor is a small, portable device. You can wear one while you do your normal daily activities. This is usually used to diagnose what is causing palpitations/syncope (passing out).  Your physician recommends that you schedule a follow-up appointment in: 03/22/14 at 8:30 a.m.   Your physician has recommended that you have a home sleep study. This test records several body functions during sleep, including: brain activity, eye movement, oxygen and carbon dioxide blood levels, heart rate and rhythm, breathing rate and rhythm, the flow of air through your mouth and nose, snoring, body muscle movements, and chest and belly movement.

## 2014-02-16 NOTE — Telephone Encounter (Signed)
New message    Patient calling has questions regarding pm medication.

## 2014-02-16 NOTE — Progress Notes (Signed)
Patient Care Team: Gerrit Heck, MD as PCP - General (Family Medicine)   HPI  Janice David is a 73 y.o. female Seen in followup for atrial fibrillation.  She was noted in the spring 2015 to have atrial fibrillation during GI evaluation. He saw Dr. Greggory Brandy who noted that she had persistent atrial fibrillation with well controlled rates. It was elected to maintain her on anticoagulation and treat her with rate control. At the last visit, we increased her metoprolol and discontinued her digoxin. We discussed cardioversion for which he was quite averse to the possibility of using flecainide to facilitate maintenance of sinus rhythm. The hope would be that we could demonstrate improved functional status in sinus rhythm.  Since that time she has had increasing problems with shortness of breath unassociated with peripheral edema. Heart rates by ECG or little bit slower. He is not sure there is related to her beta blocker.  She is also noted that she has coughed up some blood. She apparently has a lung nodule. She wonders whether some of her symptoms relate to the fact that there is something else going on.  Echocardiogram 10/14 demonstrated normal LV function; left atrial size was normal;  her thromboembolic risk profile is notable for age-16; gender-1  Functional status has been relatively stable    Past Medical History  Diagnosis Date  . Diverticulosis   . Internal hemorrhoids   . Anemia   . Asthma   . HLD (hyperlipidemia)   . Hypothyroidism   . GERD (gastroesophageal reflux disease)   . Atrial fibrillation     Past Surgical History  Procedure Laterality Date  . Cesarean section  02/07/70  . Abdominal hysterectomy  05/28/81    oavarian cyst and appendix removed  . Tubal ligation    . Tonsillectomy and adenoidectomy    . Breast lumpectomy Left   . Birth mark surgery  2/81, 1976  . Cataract extraction, bilateral    . Knee arthroscopy Left 01/23/03  . Laparoscopy  1979   . Dilation and curettage of uterus  03/20/80  . Cardioversion N/A 03/25/2013    Procedure: CARDIOVERSION;  Surgeon: Darlin Coco, MD;  Location: [facility] ENDOSCOPY;  Service: Cardiovascular;  Laterality: N/A;    Current Outpatient Prescriptions  Medication Sig Dispense Refill  . B Complex-C-Folic Acid (STRESS B COMPLEX PO) Take 1 tablet by mouth daily.      . Calcium Carbonate Antacid (TUMS E-X PO) Take 1 tablet by mouth as needed (indigestion).      . cholecalciferol (VITAMIN D) 1000 UNITS tablet Take 1,000 Units by mouth daily.       Marland Kitchen levothyroxine (SYNTHROID, LEVOTHROID) 50 MCG tablet Take 50 mcg by mouth daily before breakfast.      . metoprolol (LOPRESSOR) 50 MG tablet Take 2 tablets (100 mg total) by mouth 2 (two) times daily.  120 tablet  2  . rivaroxaban (XARELTO) 20 MG TABS tablet Take 1 tablet (20 mg total) by mouth daily with supper.  30 tablet  11  . vitamin C (ASCORBIC ACID) 500 MG tablet Take 1,000 mg by mouth daily.        No current facility-administered medications for this visit.    Allergies  Allergen Reactions  . Diltiazem Hcl     IV was OK, oral med caused nausea and possibly diarrhea.  . Sulfonamide Derivatives     Unknown reaction, possible hives/sores    Review of Systems negative except from HPI and PMH  Physical Exam BP 124/78  Pulse 89  Ht 5\' 2"  (1.575 m)  Wt 204 lb 3.2 oz (92.625 kg)  BMI 37.34 kg/m2 Well developed and well nourished in no acute distress HENT normal E scleral and icterus clear Neck Supple JVP flat; carotids brisk and full Clear to ausculation irregularly irregular   rate and rhythm, no murmurs gallops or rub Soft with active bowel sounds No clubbing cyanosis  Edema Alert and oriented, grossly normal motor and sensory function Skin Warm and Dry    Assessment and  Plan  Atrial fibrillation-persistent with a rapid ventricular response  Exercise intolerance  Sleep-disordered breathing  Hypertension  Hemoptysis  Is  not clear whether her symptoms now a related to the beta blocker, but persistence of atrial fibrillation. We will decrease his metoprolol from 100--50 twice a day. After 10 days we will begin verapamil 120 mg. After 10 more days we'll undertake a Holter to assess heart rate excursion. At her next visit we will plan to discuss whether we will proceed with cardioversion. She did attend atrial fibrillation 7 her last night given by Dr. Greggory Brandy and found very informative.   There also concerns regarding coughing up of blood. I suggested she establish followup with this with her primary care physician to look for causes of bleeding  We'll look into the sleep study  We spent more than 50% of our >40 min visit in face to face counseling regarding the above

## 2014-02-17 NOTE — Telephone Encounter (Signed)
We discussed instructions given yesterday in the office. She is understandable.

## 2014-02-20 ENCOUNTER — Telehealth: Payer: Self-pay | Admitting: Internal Medicine

## 2014-02-20 NOTE — Telephone Encounter (Signed)
Spoke with patient and she had seen Dr Rayann Heman last week at the hospital and called to tell me how she was feeling.  I will leave her some samples of her Xarelto out front.

## 2014-02-20 NOTE — Telephone Encounter (Signed)
New message     Talk to [place] only.  Pt heard Dr Rayann Heman speak somewhere recently. Claiborne Billings helped her with a problem this summer.  She saw Dr Rayann Heman about an ablation in the past---but she is Dr Olin Pia patient.

## 2014-02-28 ENCOUNTER — Other Ambulatory Visit: Payer: Self-pay | Admitting: *Deleted

## 2014-02-28 ENCOUNTER — Telehealth: Payer: Self-pay | Admitting: Internal Medicine

## 2014-02-28 DIAGNOSIS — I4891 Unspecified atrial fibrillation: Secondary | ICD-10-CM

## 2014-02-28 MED ORDER — METOPROLOL TARTRATE 50 MG PO TABS: 50.0000 mg | ORAL_TABLET | Freq: Two times a day (BID) | ORAL | Status: DC

## 2014-02-28 NOTE — Telephone Encounter (Signed)
New message     Quick question about her presc----she would not give me any more information

## 2014-02-28 NOTE — Telephone Encounter (Addendum)
Pt requesting 90 day supply of Metoprolol. Sent to [facility] per pt request.  She was also asking about Xarelto samples. Explained that we did not have any.

## 2014-03-08 ENCOUNTER — Telehealth: Payer: Self-pay

## 2014-03-08 ENCOUNTER — Encounter (INDEPENDENT_AMBULATORY_CARE_PROVIDER_SITE_OTHER): Payer: Commercial Managed Care - HMO

## 2014-03-08 ENCOUNTER — Encounter: Payer: Self-pay | Admitting: *Deleted

## 2014-03-08 DIAGNOSIS — I4819 Other persistent atrial fibrillation: Secondary | ICD-10-CM

## 2014-03-08 DIAGNOSIS — I4891 Unspecified atrial fibrillation: Secondary | ICD-10-CM

## 2014-03-08 DIAGNOSIS — I481 Persistent atrial fibrillation: Secondary | ICD-10-CM

## 2014-03-08 NOTE — Telephone Encounter (Signed)
Patient came to the office for a monitor and asked for samples of xarelto placed  A 30 day samples at front desk

## 2014-03-08 NOTE — Progress Notes (Signed)
Patient ID: Janice David, female   DOB: [birth year], 73 y.o.   MRN: [id number] [facility] 24 hour holter monitor applied to patient.

## 2014-03-22 ENCOUNTER — Ambulatory Visit (INDEPENDENT_AMBULATORY_CARE_PROVIDER_SITE_OTHER): Payer: Commercial Managed Care - HMO | Admitting: Internal Medicine

## 2014-03-22 ENCOUNTER — Encounter: Payer: Self-pay | Admitting: Internal Medicine

## 2014-03-22 VITALS — BP 118/82 | HR 81 | Ht 62.5 in | Wt 206.4 lb

## 2014-03-22 DIAGNOSIS — I509 Heart failure, unspecified: Secondary | ICD-10-CM

## 2014-03-22 DIAGNOSIS — I4819 Other persistent atrial fibrillation: Secondary | ICD-10-CM

## 2014-03-22 DIAGNOSIS — I5030 Unspecified diastolic (congestive) heart failure: Secondary | ICD-10-CM

## 2014-03-22 DIAGNOSIS — I10 Essential (primary) hypertension: Secondary | ICD-10-CM

## 2014-03-22 DIAGNOSIS — I481 Persistent atrial fibrillation: Secondary | ICD-10-CM

## 2014-03-22 NOTE — Patient Instructions (Signed)
Your physician recommends that you continue on your current medications as directed. Please refer to the Current Medication list given to you today.  Your physician recommends that you schedule a follow-up appointment in: 3 months with Dr. Caryl Comes.

## 2014-03-22 NOTE — Progress Notes (Signed)
Patient Care Team: Gerrit Heck, MD as PCP - General (Family Medicine)   HPI  Janice David is a 73 y.o. female Seen in followup for atrial fibrillation.  She was noted in the spring 2015 to have atrial fibrillation during GI evaluation. she saw Dr. Greggory Brandy who noted that she had persistent atrial fibrillation with well controlled rates. It was elected to maintain her on anticoagulation and treat her with rate control. At the last visit, we increased her metoprolol and discontinued her digoxin.    We discussed cardioversion for which she was quite averse to the possibility of using flecainide to facilitate maintenance of sinus rhythm. The hope would be that we could demonstrate improved functional status in sinus rhythm.  Since that time she has had increasing problems with shortness of breath unassociated with peripheral edema. Heart rates by ECG or little bit slower. She is not sure there is related to her beta blocker.  The plan was to decrease her beta blocker and use an adjunct calcium blocker. Holter monitoring done thereafter demonstrated a mean heart rate of 97 minimum of 77 and frequent excursions within 130 associated with exertion  She is feeling some better with the adjustments; there is some nausea  She has some sob with exertion but this is variable.  Echocardiogram 10/14 demonstrated normal LV function; left atrial size was normal;  her thromboembolic risk profile is notable for age-27; gender-1  Functional status has been relatively stable    Past Medical History  Diagnosis Date  . Diverticulosis   . Internal hemorrhoids   . Anemia   . Asthma   . HLD (hyperlipidemia)   . Hypothyroidism   . GERD (gastroesophageal reflux disease)   . Atrial fibrillation     Past Surgical History  Procedure Laterality Date  . Cesarean section  02/07/70  . Abdominal hysterectomy  05/28/81    oavarian cyst and appendix removed  . Tubal ligation    . Tonsillectomy  and adenoidectomy    . Breast lumpectomy Left   . Birth mark surgery  2/81, 1976  . Cataract extraction, bilateral    . Knee arthroscopy Left 01/23/03  . Laparoscopy  1979  . Dilation and curettage of uterus  03/20/80  . Cardioversion N/A 03/25/2013    Procedure: CARDIOVERSION;  Surgeon: Darlin Coco, MD;  Location: [facility] ENDOSCOPY;  Service: Cardiovascular;  Laterality: N/A;    Current Outpatient Prescriptions  Medication Sig Dispense Refill  . B Complex-C-Folic Acid (STRESS B COMPLEX PO) Take 1 tablet by mouth daily.    . Calcium Carbonate Antacid (TUMS E-X PO) Take 1 tablet by mouth as needed (indigestion).    . cholecalciferol (VITAMIN D) 1000 UNITS tablet Take 1,000 Units by mouth daily. Patient taking 3 caps daily.    Marland Kitchen levothyroxine (SYNTHROID, LEVOTHROID) 50 MCG tablet Take 50 mcg by mouth daily before breakfast.    . metoprolol (LOPRESSOR) 50 MG tablet Take 1 tablet (50 mg total) by mouth 2 (two) times daily. 180 tablet 2  . rivaroxaban (XARELTO) 20 MG TABS tablet Take 1 tablet (20 mg total) by mouth daily with supper. 30 tablet 11  . verapamil (CALAN-SR) 120 MG CR tablet Take 1 tablet (120 mg total) by mouth at bedtime. 30 tablet 4  . vitamin C (ASCORBIC ACID) 500 MG tablet Take 1,000 mg by mouth daily.      No current facility-administered medications for this visit.    Allergies  Allergen Reactions  . Diltiazem  Hcl     IV was OK, oral med caused nausea and possibly diarrhea.  . Sulfonamide Derivatives     Unknown reaction, possible hives/sores    Review of Systems negative except from HPI and PMH  Physical Exam BP 118/82 mmHg  Pulse 81  Ht 5' 2.5" (1.588 m)  Wt 93.622 kg (206 lb 6.4 oz)  BMI 37.13 kg/m2 Well developed and well nourished in no acute distress HENT normal E scleral and icterus clear Neck Supple JVP flat; carotids brisk and full Clear to ausculation irregularly irregular   rate and rhythm, no murmurs gallops or rub Soft with active bowel  sounds No clubbing cyanosis  Edema Alert and oriented, grossly normal motor and sensory function Skin Warm and Dry    Assessment and  Plan  Atrial fibrillation-persistent with a rapid ventricular response  Exercise intolerance  Sleep-disordered breathing  Hypertension   She is better on her current medication dose. We have a lengthy discussion again regarding rhythm control versus rate control. At this point she remains disinclined to pursue rhythm control either with outpatient antiarrhythmic initiation of dofetilide associated with cardioversion. She is further disinclined to further adjust her rate controlling medications which on her current strategy I think are in adequate based on Holter heart rates demonstrating rates greater than 1:30 exertion. We will thus defer making a decision for 2-3 months.    We'll look into the sleep study  This is pending  We spent more than 50% of our >25 min visit in face to face counseling regarding the above

## 2014-04-18 ENCOUNTER — Telehealth: Payer: Self-pay | Admitting: Internal Medicine

## 2014-04-18 NOTE — Telephone Encounter (Signed)
New Msg    Pt would like to know if samples of Xarelto 20 mg are avail?   Contact # [phone]

## 2014-04-19 ENCOUNTER — Encounter: Payer: Self-pay | Admitting: Cardiovascular Disease

## 2014-04-24 ENCOUNTER — Telehealth: Payer: Self-pay | Admitting: Internal Medicine

## 2014-04-24 NOTE — Telephone Encounter (Signed)
Patient wanted to see if we got any Xarelto samples in. Left samples at front desk for patient pick up. She is extremely grateful for the medication help.

## 2014-04-24 NOTE — Telephone Encounter (Signed)
New message      Talk to [facility]

## 2014-05-17 ENCOUNTER — Telehealth: Payer: Self-pay | Admitting: *Deleted

## 2014-05-17 ENCOUNTER — Other Ambulatory Visit: Payer: Self-pay | Admitting: *Deleted

## 2014-05-17 MED ORDER — VERAPAMIL HCL ER 120 MG PO TBCR: 120.0000 mg | EXTENDED_RELEASE_TABLET | Freq: Every day | ORAL | Status: DC

## 2014-05-17 NOTE — Telephone Encounter (Signed)
Called patient to arrange home sleep study.  Patient does not want to have testing at this time. Will discuss at next office visit with Dr. Caryl Comes in March.

## 2014-05-24 ENCOUNTER — Telehealth: Payer: Self-pay | Admitting: Internal Medicine

## 2014-05-24 ENCOUNTER — Other Ambulatory Visit: Payer: Self-pay | Admitting: *Deleted

## 2014-05-24 MED ORDER — VERAPAMIL HCL ER 120 MG PO TBCR: 120.0000 mg | EXTENDED_RELEASE_TABLET | Freq: Every day | ORAL | Status: DC

## 2014-05-24 NOTE — Telephone Encounter (Signed)
Patient called and wanted to change her verapamil rx to a 90 day.

## 2014-05-24 NOTE — Telephone Encounter (Signed)
error 

## 2014-06-23 ENCOUNTER — Ambulatory Visit: Payer: Commercial Managed Care - HMO | Admitting: Internal Medicine

## 2014-06-23 ENCOUNTER — Other Ambulatory Visit: Payer: Self-pay | Admitting: Dermatology

## 2014-07-17 ENCOUNTER — Ambulatory Visit (INDEPENDENT_AMBULATORY_CARE_PROVIDER_SITE_OTHER): Payer: Commercial Managed Care - HMO | Admitting: Internal Medicine

## 2014-07-17 ENCOUNTER — Encounter: Payer: Self-pay | Admitting: Internal Medicine

## 2014-07-17 VITALS — BP 124/82 | HR 106 | Ht 62.5 in | Wt 206.8 lb

## 2014-07-17 DIAGNOSIS — I481 Persistent atrial fibrillation: Secondary | ICD-10-CM

## 2014-07-17 DIAGNOSIS — I4891 Unspecified atrial fibrillation: Secondary | ICD-10-CM

## 2014-07-17 DIAGNOSIS — I4819 Other persistent atrial fibrillation: Secondary | ICD-10-CM

## 2014-07-17 MED ORDER — METOPROLOL TARTRATE 50 MG PO TABS: 25.0000 mg | ORAL_TABLET | Freq: Two times a day (BID) | ORAL | Status: DC

## 2014-07-17 NOTE — Progress Notes (Signed)
Patient Care Team: Leighton Ruff, MD as PCP - General (Family Medicine)   HPI  Janice David is a 74 y.o. female Seen in followup for atrial fibrillation.  She was noted in the spring 2015 to have atrial fibrillation during GI evaluation. she saw Dr. Greggory Brandy who noted that she had persistent atrial fibrillation with well controlled rates. It was elected to maintain her on anticoagulation and treat her with rate control. At the last visit, we increased her metoprolol and discontinued her digoxin.   We discussed cardioversion \ She continues to have shortness of breath with exertion;  this may be somewhat worse. She's had no peripheral edema.  Echocardiogram 10/14 demonstrated normal LV function; left atrial size was normal;  her thromboembolic risk profile is notable for age-78; gender-1;  her CHADS-VASc score is 2   She says that Dr. Greggory Brandy told her she should not have a cardioversion. I suspect this relates to the fact that she reverted rapidly to atrial fibrillation following her first cardioversion.  Past Medical History  Diagnosis Date  . Diverticulosis   . Internal hemorrhoids   . Anemia   . Asthma   . HLD (hyperlipidemia)   . Hypothyroidism   . GERD (gastroesophageal reflux disease)   . Atrial fibrillation   . Vitamin D deficiency   . Osteopenia   . Cataracta   . Vaginal polyp     Past Surgical History  Procedure Laterality Date  . Cesarean section  02/07/70  . Abdominal hysterectomy  05/28/81    oavarian cyst and appendix removed  . Tubal ligation    . Tonsillectomy and adenoidectomy    . Breast lumpectomy Left   . Birth mark surgery  2/81, 1976  . Cataract extraction, bilateral    . Knee arthroscopy Left 01/23/03  . Laparoscopy  1979  . Dilation and curettage of uterus  03/20/80  . Cardioversion N/A 03/25/2013    Procedure: CARDIOVERSION;  Surgeon: Darlin Coco, MD;  Location: [facility] ENDOSCOPY;  Service: Cardiovascular;  Laterality: N/A;    Current  Outpatient Prescriptions  Medication Sig Dispense Refill  . Calcium Carbonate Antacid (TUMS E-X PO) Take 1 tablet by mouth as needed (indigestion).    . cholecalciferol (VITAMIN D) 1000 UNITS tablet Take 1,000 Units by mouth daily. Patient taking 4 caps (4000 units) daily by mouth.    . levothyroxine (SYNTHROID, LEVOTHROID) 50 MCG tablet Take 50 mcg by mouth daily before breakfast.    . metoprolol (LOPRESSOR) 50 MG tablet Take 1 tablet (50 mg total) by mouth 2 (two) times daily. (Patient taking differently: Take 50 mg by mouth daily. ) 180 tablet 2  . rivaroxaban (XARELTO) 20 MG TABS tablet Take 1 tablet (20 mg total) by mouth daily with supper. 30 tablet 11  . verapamil (CALAN-SR) 120 MG CR tablet Take 1 tablet (120 mg total) by mouth at bedtime. 90 tablet 0  . B Complex-C-Folic Acid (STRESS B COMPLEX PO) Take 1 tablet by mouth daily.    . vitamin C (ASCORBIC ACID) 500 MG tablet Take 1,000 mg by mouth daily.      No current facility-administered medications for this visit.    Allergies  Allergen Reactions  . Diltiazem Hcl     IV was OK, oral med caused nausea and possibly diarrhea.  . Sulfonamide Derivatives     Unknown reaction, possible hives/sores    Review of Systems negative except from HPI and PMH  Physical Exam BP 124/82 mmHg  Pulse  106  Ht 5' 2.5" (1.588 m)  Wt 206 lb 12.8 oz (93.804 kg)  BMI 37.20 kg/m2 Well developed and well nourished in no acute distress HENT normal E scleral and icterus clear Neck Supple JVP flat; carotids brisk and full Clear to ausculation irregularly irregular   rate and rhythm, no murmurs gallops or rub Soft with active bowel sounds No clubbing cyanosis  Edema Alert and oriented, grossly normal motor and sensory function Skin Warm and Dry    Assessment and  Plan  Atrial fibrillation-persistent with a rapid ventricular response  Exercise intolerance  Sleep-disordered breathing  Hypertension  We have continued to discuss again  the issues of rate control versus rhythm control, the role of cardioversion adjunctly to antiarrhythmic therapy, and potential side effects from her beta blockers and her calcium blockers.  She remains disinclined to pursue a sleep study. At this juncture she would like to continue on her verapamil and she will take her metoprolol twice daily. She understands that I am concerned about her resting tachycardia and potential implication for tachycardia-induced cardiomyopathy as well as exercise intolerance.   we discussed the issue of her cardioversion with or without support from antiarrhythmic drugs again.  She remains disinclined to consider catheter ablation.   We spent more than 50% of our >45 min visit in face to face counseling regarding the above  We will plan to regroup in about 6 weeks to consider alternative rate controlling regimes.

## 2014-07-17 NOTE — Patient Instructions (Signed)
Your physician has recommended you make the following change in your medication:  1) DECREASE Metoprolol Tartrate to 25 mg twice daily  Handwritten prescription given to you today for BNP.  Your physician recommends that you schedule a follow-up appointment in: 6 weeks with Dr. Caryl Comes.

## 2014-08-08 ENCOUNTER — Other Ambulatory Visit: Payer: Self-pay | Admitting: Internal Medicine

## 2014-08-11 ENCOUNTER — Encounter: Payer: Self-pay | Admitting: Internal Medicine

## 2014-08-21 ENCOUNTER — Telehealth: Payer: Self-pay | Admitting: Internal Medicine

## 2014-08-21 NOTE — Telephone Encounter (Signed)
error 

## 2014-08-21 NOTE — Telephone Encounter (Signed)
New message       Pt is calling about lab results she was given from her GP doctor   pt states she called her GP doctor for explanation of results and she was told to call here   Pt is aware that Janice David is not in today

## 2014-08-21 NOTE — Telephone Encounter (Signed)
I spoke with patient and answered her questions about BNP results done at Dr Drema Dallas' office 08/10/14. She states this was ordered by Dr Caryl Comes.  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 Pt advised I did not see this information on the copy of the lab report Dr Caryl Comes received from Dr Drema Dallas and I was unsure what that was referring to.  Pt advised that I did not think this was something that Dr Caryl Comes would be managing and asked her to call Dr Drema Dallas' to discuss this reference.  Pt advised that she could bring a copy of this report to office visit with Dr Caryl Comes 08/28/14. Pt offered to fax a copy of her report to Dr Mariane Duval given our fax number and  advised she could fax a copy to my attention/Sherri/Dr Caryl Comes.  Pt advised I would forward to Dr Caryl Comes for review

## 2014-08-23 ENCOUNTER — Encounter: Payer: Self-pay | Admitting: Internal Medicine

## 2014-08-28 ENCOUNTER — Encounter: Payer: Self-pay | Admitting: Internal Medicine

## 2014-08-28 ENCOUNTER — Ambulatory Visit (INDEPENDENT_AMBULATORY_CARE_PROVIDER_SITE_OTHER): Payer: Commercial Managed Care - HMO | Admitting: Internal Medicine

## 2014-08-28 VITALS — BP 112/72 | HR 85 | Ht 62.5 in | Wt 208.2 lb

## 2014-08-28 DIAGNOSIS — I481 Persistent atrial fibrillation: Secondary | ICD-10-CM

## 2014-08-28 DIAGNOSIS — I4819 Other persistent atrial fibrillation: Secondary | ICD-10-CM

## 2014-08-28 MED ORDER — FUROSEMIDE 20 MG PO TABS: 20.0000 mg | ORAL_TABLET | Freq: Every day | ORAL | Status: DC

## 2014-08-28 NOTE — Patient Instructions (Signed)
Medication Instructions:  Your physician has recommended you make the following change in your medication:  1) START Furosemide 20 mg daily for 5 days.  Then take one tablet daily as needed.  Labwork: None  Testing/Procedures: None  Follow-Up: Your physician wants you to follow-up in: 6 months with Dr. Caryl Comes. You will receive a reminder letter in the mail two months in advance. If you don't receive a letter, please call our office to schedule the follow-up appointment.   Any Other Special Instructions Will Be Listed Below (If Applicable).

## 2014-08-28 NOTE — Progress Notes (Signed)
Patient Care Team: Leighton Ruff, MD as PCP - General (Family Medicine)   HPI  Janice David is a 74 y.o. female Seen in followup for atrial fibrillation.  She was noted in the spring 2015 to have atrial fibrillation during GI evaluation. she saw Dr. Greggory Brandy who noted that she had persistent atrial fibrillation with well controlled rates. It was elected to maintain her on anticoagulation and treat her with rate control. At the last visit, we increased her metoprolol and discontinued her digoxin.   We discussed cardioversion   She remains averse to this. She looked into the cost of flecainide and she is not sure about that. I've also given her the name propafenone to think about taking this as an alternative.   Somehow also, there is an error in a blood sample where it was labeled as the indication that she had cancer of her performed sinus. This is for a BNP which turned out to be elevated. \ She continues to have shortness of breath with exertion;  this may be somewhat worse. She's had no peripheral edema.  Echocardiogram 10/14 demonstrated normal LV function; left atrial size was normal;  her thromboembolic risk profile is notable for age-74; gender-1;  her CHADS-VASc score is 2   She says that Dr. Greggory Brandy told her she should not have a cardioversion. I suspect this relates to the fact that she reverted rapidly to atrial fibrillation following her first cardioversion.  Past Medical History  Diagnosis Date  . Diverticulosis   . Internal hemorrhoids   . Anemia   . Asthma   . HLD (hyperlipidemia)   . Hypothyroidism   . GERD (gastroesophageal reflux disease)   . Atrial fibrillation   . Vitamin D deficiency   . Osteopenia   . Cataracta   . Vaginal polyp     Past Surgical History  Procedure Laterality Date  . Cesarean section  02/07/70  . Abdominal hysterectomy  05/28/81    oavarian cyst and appendix removed  . Tubal ligation    . Tonsillectomy and adenoidectomy    . Breast  lumpectomy Left   . Birth mark surgery  2/81, 1976  . Cataract extraction, bilateral    . Knee arthroscopy Left 01/23/03  . Laparoscopy  1979  . Dilation and curettage of uterus  03/20/80  . Cardioversion N/A 03/25/2013    Procedure: CARDIOVERSION;  Surgeon: Darlin Coco, MD;  Location: [facility] ENDOSCOPY;  Service: Cardiovascular;  Laterality: N/A;    Current Outpatient Prescriptions  Medication Sig Dispense Refill  . cholecalciferol (VITAMIN D) 1000 UNITS tablet Take 1,000 Units by mouth daily. Patient taking 4 caps (4000 units) daily by mouth.    . levothyroxine (SYNTHROID, LEVOTHROID) 50 MCG tablet Take 50 mcg by mouth daily before breakfast.    . metoprolol (LOPRESSOR) 50 MG tablet Take 0.5 tablets (25 mg total) by mouth 2 (two) times daily. 60 tablet 2  . rivaroxaban (XARELTO) 20 MG TABS tablet Take 1 tablet (20 mg total) by mouth daily with supper. 30 tablet 11  . verapamil (CALAN-SR) 120 MG CR tablet TAKE 1 TABLET AT BEDTIME  (NEW  MEDICATION) 90 tablet 1  . vitamin C (ASCORBIC ACID) 500 MG tablet Take 1,000 mg by mouth daily.      No current facility-administered medications for this visit.    Allergies  Allergen Reactions  . Diltiazem Hcl     IV was OK, oral med caused nausea and possibly diarrhea.  . Sulfonamide Derivatives  Unknown reaction, possible hives/sores    Review of Systems negative except from HPI and PMH  Physical Exam BP 112/72 mmHg  Pulse 85  Ht 5' 2.5" (1.588 m)  Wt 208 lb 3.2 oz (94.439 kg)  BMI 37.45 kg/m2 Well developed and well nourished in no acute distress HENT normal E scleral and icterus clear Neck Supple JVP flat; carotids brisk and full Clear to ausculation irregularly irregular rate and rhythm, no murmurs gallops or rub Soft with active bowel sounds No clubbing cyanosis  Edema Alert and oriented, grossly normal motor and sensory function Skin Warm and Dry   ECG demonstrates atrial fibrillation at a rate of 75 bpm  Assessment  and  Plan  Atrial fibrillation-persistent with a rapid ventricular response  Exercise intolerance  Sleep-disordered breathing  Hypertension   hiatal hernia  We have continued to discuss again the issues of rate control versus rhythm control, the role of cardioversion adjunctly to antiarrhythmic therapy, and potential side effects from her beta blockers and her calcium blockers.    Blood pressure is well-controlled. Her atrial fibrillation rate seems to be better.   With her elevated BNP ,   We will begin her on low-dose Lasix from 20 mg a day for 5 days. After that she will use it as needed.   She remains uncertain as to whether she would like to pursue cardioversion.   she also brings a report that she has a retrocardiac hiatal hernia ; complaints of nocturnal chest discomfort with shoulder discomfort is concerning for a GED origin. I suggested she try PPI. She will think about this.  We spent more than 50% of our >45 min visit in face to face counseling regarding the above  We will plan to regroup in about 6 weeks to consider alternative rate controlling regimes.

## 2015-01-10 ENCOUNTER — Other Ambulatory Visit: Payer: Self-pay | Admitting: *Deleted

## 2015-01-10 ENCOUNTER — Telehealth: Payer: Self-pay | Admitting: Internal Medicine

## 2015-01-10 DIAGNOSIS — I4891 Unspecified atrial fibrillation: Secondary | ICD-10-CM

## 2015-01-10 MED ORDER — METOPROLOL TARTRATE 50 MG PO TABS: 25.0000 mg | ORAL_TABLET | Freq: Two times a day (BID) | ORAL | Status: DC

## 2015-01-10 NOTE — Telephone Encounter (Signed)
Error

## 2015-01-16 ENCOUNTER — Telehealth: Payer: Self-pay | Admitting: Internal Medicine

## 2015-01-16 NOTE — Telephone Encounter (Signed)
New message      Talk to [facility] about xarelto

## 2015-01-16 NOTE — Telephone Encounter (Signed)
Left 1 months of Xarelto samples at front desk for patient to pick up.  She is very grateful for helping with this as she is working on finding help to pay for this med while in "d[place]nut [place]".

## 2015-01-31 ENCOUNTER — Telehealth: Payer: Self-pay | Admitting: *Deleted

## 2015-01-31 NOTE — Telephone Encounter (Signed)
Xarelto samples placed at the front desk for patient. 

## 2015-02-05 ENCOUNTER — Telehealth: Payer: Self-pay | Admitting: Internal Medicine

## 2015-02-05 NOTE — Telephone Encounter (Signed)
Informed patient I was leaving samples at front desk for her to pick up. She is working on finding assistance to help with cost of this medication. She thanks me for my help.

## 2015-02-05 NOTE — Telephone Encounter (Signed)
New message      For Sherri Pt want to know if Venida Jarvis has any samples of xarelto 20mg .  Mindy has a few samples waiting for her, but pt states Sherri sometimes has samples.  She is looking for more samples than what Mindy has.  Please call

## 2015-02-12 ENCOUNTER — Other Ambulatory Visit: Payer: Self-pay | Admitting: *Deleted

## 2015-02-12 MED ORDER — VERAPAMIL HCL ER 120 MG PO TBCR: EXTENDED_RELEASE_TABLET | ORAL | Status: DC

## 2015-03-19 ENCOUNTER — Telehealth: Payer: Self-pay | Admitting: Internal Medicine

## 2015-03-19 NOTE — Telephone Encounter (Signed)
Called pt to inform her that we could leave her a couple of bottle of Xarelto 20 mg tablet, enough for 10 days. I also gave the pt the number to Medicare low-income subsidy program. I advised the pt the if she has any other problems, questions or concerns to call the office. Pt verbalized understanding.

## 2015-03-23 ENCOUNTER — Encounter: Payer: Self-pay | Admitting: Internal Medicine

## 2015-03-23 ENCOUNTER — Ambulatory Visit (INDEPENDENT_AMBULATORY_CARE_PROVIDER_SITE_OTHER): Payer: Commercial Managed Care - HMO | Admitting: Internal Medicine

## 2015-03-23 VITALS — BP 140/108 | HR 104 | Ht 62.5 in | Wt 210.6 lb

## 2015-03-23 DIAGNOSIS — I481 Persistent atrial fibrillation: Secondary | ICD-10-CM | POA: Diagnosis not present

## 2015-03-23 DIAGNOSIS — I4819 Other persistent atrial fibrillation: Secondary | ICD-10-CM

## 2015-03-23 NOTE — Patient Instructions (Signed)
Medication Instructions: - no changes  Labwork: - none  Procedures/Testing: - none  Follow-Up: - Your physician recommends that you schedule a follow-up appointment in: 2 months with Roderic Palau, NP (A-fib clinic).  Any Additional Special Instructions Will Be Listed Below (If Applicable). - none

## 2015-03-23 NOTE — Progress Notes (Signed)
Patient Care Team: Leighton Ruff, MD as PCP - General (Family Medicine)   HPI  Janice David is a 74 y.o. female Seen in followup for atrial fibrillation.  She was noted in the spring 2015 to have atrial fibrillation during GI evaluation. she saw Dr. Greggory Brandy who noted that she had persistent atrial fibrillation with well controlled rates. It was elected to maintain her on anticoagulation and treat her with rate control. At the last visit, we increased her metoprolol and discontinued her digoxin.   We discussed cardioversion   She remains averse to this. She looked into the cost of flecainide and she is not sure about that. I've also given her the name propafenone to think about taking this as an alternative.   She continues to have shortness of breath with exertion;  this may be somewhat worse.   Echocardiogram 10/14 demonstrated normal LV function; left atrial size was normal;  her thromboembolic risk profile is notable for age-34; gender-1;  her CHADS-VASc score is 2   \she continues with [facility] and [place] Past Medical History  Diagnosis Date  . Diverticulosis   . Internal hemorrhoids   . Anemia   . Asthma   . HLD (hyperlipidemia)   . Hypothyroidism   . GERD (gastroesophageal reflux disease)   . Atrial fibrillation   . Vitamin D deficiency   . Osteopenia   . Cataracta   . Vaginal polyp     Past Surgical History  Procedure Laterality Date  . Cesarean section  02/07/70  . Abdominal hysterectomy  05/28/81    oavarian cyst and appendix removed  . Tubal ligation    . Tonsillectomy and adenoidectomy    . Breast lumpectomy Left   . Birth mark surgery  2/81, 1976  . Cataract extraction, bilateral    . Knee arthroscopy Left 01/23/03  . Laparoscopy  1979  . Dilation and curettage of uterus  03/20/80  . Cardioversion N/A 03/25/2013    Procedure: CARDIOVERSION;  Surgeon: Darlin Coco, MD;  Location: [facility] ENDOSCOPY;  Service: Cardiovascular;  Laterality: N/A;    Current  Outpatient Prescriptions  Medication Sig Dispense Refill  . cholecalciferol (VITAMIN D) 1000 UNITS tablet Take 1,000 Units by mouth daily. Patient taking 4 caps (4000 units) daily by mouth.    . furosemide (LASIX) 20 MG tablet Take 1 tablet (20 mg total) by mouth daily. For 5 days.  Then take 1 tablet daily as needed. 15 tablet 3  . levothyroxine (SYNTHROID, LEVOTHROID) 50 MCG tablet Take 50 mcg by mouth daily before breakfast.    . metoprolol (LOPRESSOR) 50 MG tablet Take 0.5 tablets (25 mg total) by mouth 2 (two) times daily. 90 tablet 0  . rivaroxaban (XARELTO) 20 MG TABS tablet Take 1 tablet (20 mg total) by mouth daily with supper. 30 tablet 11  . verapamil (CALAN-SR) 120 MG CR tablet TAKE 1 TABLET AT BEDTIME  (NEW  MEDICATION) 90 tablet 1  . vitamin C (ASCORBIC ACID) 500 MG tablet Take 1,000 mg by mouth daily.      No current facility-administered medications for this visit.    Allergies  Allergen Reactions  . Diltiazem Hcl     IV was OK, oral med caused nausea and possibly diarrhea.  . Sulfonamide Derivatives     Unknown reaction, possible hives/sores    Review of Systems negative except from HPI and PMH  Physical Exam There were no vitals taken for this visit. Well developed and well nourished in  no acute distress HENT normal E scleral and icterus clear Neck Supple JVP flat; carotids brisk and full Clear to ausculation irregularly irregular rate and rhythm, no murmurs gallops or rub Soft with active bowel sounds No clubbing cyanosis  Edema Alert and oriented, grossly normal motor and sensory function Skin Warm and Dry   ECG demonstrates atrial fibrillation at a rate of 75 bpm  Assessment and  Plan  Atrial fibrillation-persistent with a rapid ventricular response  Exercise intolerance  Sleep-disordered breathing  Hypertension  Hiatal hernia     We have continued to discuss cardioversion. She remains ambivalent. She is looked into issues of cost with  flecainide and propafenone.  She is willing to consider these at the beginning of the year.  Her heart rate is fast today but she is not inclined to think about changing her medications. It is her impression that the metoprolol makes her feel tired.  I will have her meet with Roderic Palau the beginning of the year to discuss proceeding with cardioversion using either flecainide or propafenone as supportive antiarrhythmic therapy  We would anticipate Myoview scanning in the wake of drug initiation  BP is modestly

## 2015-04-02 ENCOUNTER — Encounter (HOSPITAL_COMMUNITY): Payer: Self-pay

## 2015-05-07 ENCOUNTER — Other Ambulatory Visit: Payer: Self-pay | Admitting: Internal Medicine

## 2015-05-08 ENCOUNTER — Other Ambulatory Visit: Payer: Self-pay | Admitting: *Deleted

## 2015-05-08 DIAGNOSIS — I4891 Unspecified atrial fibrillation: Secondary | ICD-10-CM

## 2015-05-08 MED ORDER — RIVAROXABAN 20 MG PO TABS: 20.0000 mg | ORAL_TABLET | Freq: Every day | ORAL | Status: DC

## 2015-05-08 MED ORDER — METOPROLOL TARTRATE 50 MG PO TABS: 25.0000 mg | ORAL_TABLET | Freq: Two times a day (BID) | ORAL | Status: DC

## 2015-05-10 ENCOUNTER — Telehealth: Payer: Self-pay | Admitting: Internal Medicine

## 2015-05-10 NOTE — Telephone Encounter (Signed)
°*  STAT* If patient is at the pharmacy, call can be transferred to refill team.   1. Which medications need to be refilled? (please list name of each medication and dose if known) Xarelto 20mg   2. Which pharmacy/location (including street and city if local pharmacy) is medication to be sent to?[facility]/  3. Do they need a 30 day or 90 day supply? [place]

## 2015-05-10 NOTE — Telephone Encounter (Signed)
Pt's Rx was sent to pt's pharmacy as requested. Confirmation received.  °

## 2015-05-23 ENCOUNTER — Inpatient Hospital Stay (HOSPITAL_COMMUNITY)
Admission: RE | Admit: 2015-05-23 | Payer: Commercial Managed Care - HMO | Source: Ambulatory Visit | Admitting: Nurse Practitioner

## 2015-05-28 ENCOUNTER — Ambulatory Visit (HOSPITAL_COMMUNITY)
Admission: RE | Admit: 2015-05-28 | Discharge: 2015-05-28 | Disposition: A | Discharge status: Home / Self Care | Payer: Commercial Managed Care - HMO | Source: Ambulatory Visit | Attending: Nurse Practitioner | Admitting: Nurse Practitioner

## 2015-05-28 VITALS — BP 130/92 | HR 95 | Ht 62.0 in | Wt 209.4 lb

## 2015-05-28 DIAGNOSIS — I4891 Unspecified atrial fibrillation: Secondary | ICD-10-CM | POA: Insufficient documentation

## 2015-05-28 DIAGNOSIS — R9431 Abnormal electrocardiogram [ECG] [EKG]: Secondary | ICD-10-CM | POA: Diagnosis not present

## 2015-05-28 DIAGNOSIS — I482 Chronic atrial fibrillation, unspecified: Secondary | ICD-10-CM

## 2015-05-29 ENCOUNTER — Encounter (HOSPITAL_COMMUNITY): Payer: Self-pay | Admitting: Nurse Practitioner

## 2015-05-29 NOTE — Progress Notes (Signed)
Patient ID: Janice David, female   DOB: 14-Mar-1941, 75 y.o.   MRN: [id number]     Primary Care Physician: Janice Heck, MD Referring Physician: Dr. Celine Mans TYCHELLE David is a 75 y.o. female with a h/o chronic afib since 02/2013, at which time it was found at at scheduled MD f/u appointment. Pt was symptomatic. She has been rate controlled over this time, failed cardiversion after being anticoagulated with xarelto. Marland Kitchen She was seen by Dr. Rayann Heman who offered sotalol or tikosyn which she refusre. She would be considered for ablation if she failed antiarrythmic's. She has been seen multiple times by Dr. Caryl Comes and the pt cannot come to a decision re antiarrythmic therapy.  In the afib clinic today, we again discuss antiarrythmic therapy and the risk vrs benefit of the drugs. I think at this point, pt being in chronic afib since 2014 that it will probably take tikosyn to return SR, if it is possible. We also discussed sotalol, flecainide and Rythmol. Pt seems still very unsure to change approach, but feels as the years move on, her exercise tolerance is getting poorer.   Today, she denies symptoms of palpitations, chest pain, shortness of breath, orthopnea, PND, lower extremity edema, dizziness, presyncope, syncope, or neurologic sequela. The patient is tolerating medications without difficulties and is otherwise without complaint today.   Past Medical History  Diagnosis Date  . Diverticulosis   . Internal hemorrhoids   . Anemia   . Asthma   . HLD (hyperlipidemia)   . Hypothyroidism   . GERD (gastroesophageal reflux disease)   . Atrial fibrillation ([place])   . Vitamin D deficiency   . Osteopenia   . Cataracta   . Vaginal polyp    Past Surgical History  Procedure Laterality Date  . Cesarean section  02/07/70  . Abdominal hysterectomy  05/28/81    oavarian cyst and appendix removed  . Tubal ligation    . Tonsillectomy and adenoidectomy    . Breast lumpectomy Left   . Birth mark  surgery  2/81, 1976  . Cataract extraction, bilateral    . Knee arthroscopy Left 01/23/03  . Laparoscopy  1979  . Dilation and curettage of uterus  03/20/80  . Cardioversion N/A 03/25/2013    Procedure: CARDIOVERSION;  Surgeon: Darlin Coco, MD;  Location: [facility] ENDOSCOPY;  Service: Cardiovascular;  Laterality: N/A;    Current Outpatient Prescriptions  Medication Sig Dispense Refill  . cholecalciferol (VITAMIN D) 1000 UNITS tablet Take 1,000 Units by mouth daily. Patient taking 4 caps (4000 units) daily by mouth.    . levothyroxine (SYNTHROID, LEVOTHROID) 50 MCG tablet Take 50 mcg by mouth daily before breakfast.    . metoprolol (LOPRESSOR) 50 MG tablet Take 0.5 tablets (25 mg total) by mouth 2 (two) times daily. 90 tablet 3  . rivaroxaban (XARELTO) 20 MG TABS tablet Take 1 tablet (20 mg total) by mouth daily with supper. 90 tablet 3  . verapamil (CALAN-SR) 120 MG CR tablet TAKE 1 TABLET AT BEDTIME  (NEW  MEDICATION) 90 tablet 1  . vitamin C (ASCORBIC ACID) 500 MG tablet Take 1,000 mg by mouth as needed.      No current facility-administered medications for this encounter.    Allergies  Allergen Reactions  . Diltiazem Hcl     IV was OK, oral med caused nausea and possibly diarrhea.  . Sulfonamide Derivatives     Unknown reaction, possible hives/sores    Social History   Social History  .  Marital Status: Divorced    Spouse Name: N/A  . Number of Children: 2  . Years of Education: N/A   Occupational History  . retired    Social History Main Topics  . Smoking status: Never Smoker   . Smokeless tobacco: Never Used  . Alcohol Use: Yes     Comment: occassionally  . Drug Use: No  . Sexual Activity: Not on file   Other Topics Concern  . Not on file   Social History Narrative    Family History  Problem Relation Age of Onset  . Skin cancer Father   . Heart disease Mother   . Colon cancer Neg Hx     ROS- All systems are reviewed and negative except as per the HPI  above  Physical Exam: Filed Vitals:   05/28/15 1346  BP: 130/92  Pulse: 95  Height: 5\' 2"  (1.575 m)  Weight: 209 lb 6.4 oz (94.983 kg)    GEN- The patient is well appearing, alert and oriented x 3 today.   Head- normocephalic, atraumatic Eyes-  Sclera clear, conjunctiva pink Ears- hearing intact Oropharynx- clear Neck- supple, no JVP Lymph- no cervical lymphadenopathy Lungs- Clear to ausculation bilaterally, normal work of breathing Heart- Regular rate and rhythm, no murmurs, rubs or gallops, PMI not laterally displaced GI- soft, NT, ND, + BS Extremities- no clubbing, cyanosis, or edema MS- no significant deformity or atrophy Skin- no rash or lesion Psych- euthymic mood, full affect Neuro- strength and sensation are intact  EKG-afib with v rate of 95 bpm, QRS int 80 ms, QTc 427 ms Epic records reviewed  Assessment and Plan: 1. Chronic afib Pt has been given options to use AAD therapy discussed over many visits with Dr. Caryl Comes, which she has shown hesitation to start Discussed these options again today I am leaning toward tikosyn or amiodarone since she has had longstanding afib and it may prove difficult to return her to SR Pt does not like side effect profile of amiodarone Continue xarelto Continue verapamil, metoprolol for rate control She will think about options and let me know when ready to commit  [name], [facility] [address] [place], [place] [address] [phone]

## 2015-05-31 ENCOUNTER — Telehealth (HOSPITAL_COMMUNITY): Payer: Self-pay | Admitting: Nurse Practitioner

## 2015-05-31 NOTE — Telephone Encounter (Signed)
Pt called and let me know she is still researching flecainide/tiksoyn and she will let me know when she is ready to commit.

## 2015-07-02 ENCOUNTER — Encounter: Payer: Self-pay | Admitting: Family Medicine

## 2015-07-23 ENCOUNTER — Other Ambulatory Visit: Payer: Self-pay | Admitting: *Deleted

## 2015-07-23 DIAGNOSIS — I4891 Unspecified atrial fibrillation: Secondary | ICD-10-CM

## 2015-07-23 MED ORDER — VERAPAMIL HCL ER 120 MG PO TBCR: EXTENDED_RELEASE_TABLET | ORAL | Status: DC

## 2015-07-23 MED ORDER — METOPROLOL TARTRATE 25 MG PO TABS: 25.0000 mg | ORAL_TABLET | Freq: Two times a day (BID) | ORAL | Status: DC

## 2015-07-23 NOTE — Telephone Encounter (Signed)
Patient called to have the rx's for verapamil and metoprolol sent to [facility] mail order. She would like the metoprolol changed to the 25mg  tablet so that she does not have to cut them in half.

## 2015-10-02 ENCOUNTER — Ambulatory Visit (INDEPENDENT_AMBULATORY_CARE_PROVIDER_SITE_OTHER): Payer: Commercial Managed Care - HMO | Admitting: Gastroenterology

## 2015-10-02 ENCOUNTER — Encounter: Payer: Self-pay | Admitting: Gastroenterology

## 2015-10-02 ENCOUNTER — Telehealth: Payer: Self-pay | Admitting: *Deleted

## 2015-10-02 VITALS — BP 110/68 | HR 68 | Ht 62.5 in | Wt 206.0 lb

## 2015-10-02 DIAGNOSIS — K219 Gastro-esophageal reflux disease without esophagitis: Secondary | ICD-10-CM | POA: Diagnosis not present

## 2015-10-02 DIAGNOSIS — Z1211 Encounter for screening for malignant neoplasm of colon: Secondary | ICD-10-CM | POA: Diagnosis not present

## 2015-10-02 DIAGNOSIS — K642 Third degree hemorrhoids: Secondary | ICD-10-CM | POA: Diagnosis not present

## 2015-10-02 NOTE — Addendum Note (Signed)
Addended by: Mauri Pole on: 10/02/2015 11:09 AM   Modules accepted: Level of Service

## 2015-10-02 NOTE — Telephone Encounter (Signed)
  10/02/2015   RE: Janice David DOB: 06/15/1940 MRN: [id number]   Dear : Hartley Barefoot, MD            Roderic Palau, NP            Thompson Grayer, MD    We have scheduled the above patient for an endoscopic procedure. Our records show that she is on anticoagulation therapy.   Please advise as to how long the patient may come off her therapy of Xarelto prior to the procedure, which is scheduled for  (Pending patient scheduling procedure).  Please fax back/ or route the completed form to [place] at [phone].   Sincerely,    Genella Mech

## 2015-10-02 NOTE — Patient Instructions (Signed)
You have been scheduled for an Upper GI Series/Small bowel follow through at [facility] Your appointment is on 10/10/2015 at 11:30am. Please arrive 15 minutes prior to your test for registration. Make sure not to eat or drink anything after midnight on the night before your test. If you need to reschedule, please call radiology at [phone]. ________________________________________________________________ An upper GI series uses x rays to help diagnose problems of the upper GI tract, which includes the esophagus, stomach, and duodenum. The duodenum is the first part of the small intestine. An upper GI series is conducted by a radiology technologist or a radiologist-a doctor who specializes in x-ray imaging-at a hospital or outpatient center. While sitting or standing in front of an x-ray machine, the patient drinks barium liquid, which is often white and has a chalky consistency and taste. The barium liquid coats the lining of the upper GI tract and makes signs of disease show up more clearly on x rays. X-ray video, called fluoroscopy, is used to view the barium liquid moving through the esophagus, stomach, and duodenum. Additional x rays and fluoroscopy are performed while the patient lies on an x-ray table. To fully coat the upper GI tract with barium liquid, the technologist or radiologist may press on the abdomen or ask the patient to change position. Patients hold still in various positions, allowing the technologist or radiologist to take x rays of the upper GI tract at different angles. If a technologist conducts the upper GI series, a radiologist will later examine the images to look for problems.  This test typically takes about 1 hour to complete. __________________________________________________________________  It has been recommended to you by your physician that you have a(n) Colonoscopy completed. Per your request, we did not schedule the procedure(s) today. Please contact our  office at [phone] should you decide to have the procedure completed.  It has been recommended to you by your physician that you have a(n) Hemorrhoidal banding completed after your colonoscopy. Per your request, we did not schedule the procedure(s) today. Please contact our office at [phone] should you decide to have the procedure completed.  Suprep sample kit given We will contact your cardiologist about holding your Xarelto 3 days prior to your procedure

## 2015-10-02 NOTE — Progress Notes (Signed)
JASANI AKRIDGE    [id number]    Feb 23, 1941  Primary Care Physician:BARNES,ELIZABETH Nicole Kindred, MD  Referring Physician: Leighton Ruff, MD [place], [place] [address]  Chief complaint:  To discuss Colonoscopy  HPI:  53 yr F previously followed by Dr. Olevia Perches is here to establish care. She was last seen in 2014, and her last colonoscopy in 2004 showed mild diverticulosis and internal hemorrhoids. Patient complains of hemorrhoidal prolapse that sometimes occasionally she has to reduce and also intermittent bright red blood per rectum . She was diagnosed with A. fib in 2014 and is on anticoagulation with Xarelto. She is currently rate controlled and had a failed cardioversion in the past. She did not schedule colonoscopy in the interim because she was concerned about stopping anticoagulation and had other changes in her social life which precluded her from scheduling the colonoscopy per patient. She also complains of dyspepsia, burping, occasional heartburn and regurgitation for which she takes Mylanta as needed. She is reluctant to take any PPI. She feels her symptoms are well controlled with her lifestyle changes and she has breakthrough maybe once a week. Denies any dysphagia or odynophagia. She is concerned about possible hiatal hernia which could be causing the A. fib and is requesting study to evaluate for hiatal hernia   Outpatient Encounter Prescriptions as of 10/02/2015  Medication Sig  . Ca Carbonate-Mag Hydroxide (MYLANTA ULTRA PO) Take by mouth.  . calcium carbonate (TUMS - DOSED IN MG ELEMENTAL CALCIUM) 500 MG chewable tablet Chew 1 tablet by mouth daily.  . cholecalciferol (VITAMIN D) 1000 UNITS tablet Take 1,000 Units by mouth daily. Patient taking 4 caps (4000 units) daily by mouth.  . levothyroxine (SYNTHROID, LEVOTHROID) 50 MCG tablet Take 50 mcg by mouth daily before breakfast.  . metoprolol tartrate (LOPRESSOR) 25 MG tablet Take 1 tablet (25 mg  total) by mouth 2 (two) times daily.  . rivaroxaban (XARELTO) 20 MG TABS tablet Take 1 tablet (20 mg total) by mouth daily with supper.  . verapamil (CALAN-SR) 120 MG CR tablet TAKE 1 TABLET BY MOUTH AT BEDTIME  . [DISCONTINUED] vitamin C (ASCORBIC ACID) 500 MG tablet Take 1,000 mg by mouth as needed.    No facility-administered encounter medications on file as of 10/02/2015.    Allergies as of 10/02/2015 - Review Complete 10/02/2015  Allergen Reaction Noted  . Diltiazem hcl  02/10/2013  . Sulfonamide derivatives      Past Medical History  Diagnosis Date  . Diverticulosis   . Internal hemorrhoids   . Anemia   . Asthma   . HLD (hyperlipidemia)   . Hypothyroidism   . GERD (gastroesophageal reflux disease)   . Atrial fibrillation ([place])   . Vitamin D deficiency   . Osteopenia   . Cataracta   . Vaginal polyp     Past Surgical History  Procedure Laterality Date  . Cesarean section  02/07/70  . Abdominal hysterectomy  05/28/81    oavarian cyst and appendix removed  . Tubal ligation    . Tonsillectomy and adenoidectomy    . Breast lumpectomy Left   . Birth mark surgery  2/81, 1976  . Cataract extraction, bilateral    . Knee arthroscopy Left 01/23/03  . Laparoscopy  1979  . Dilation and curettage of uterus  03/20/80  . Cardioversion N/A 03/25/2013    Procedure: CARDIOVERSION;  Surgeon: Darlin Coco, MD;  Location: [place];  Service: Cardiovascular;  Laterality: N/A;  Family History  Problem Relation Age of Onset  . Skin cancer Father   . Heart disease Mother   . Colon cancer Neg Hx     Social History   Social History  . Marital Status: Divorced    Spouse Name: N/A  . Number of Children: 2  . Years of Education: N/A   Occupational History  . retired    Social History Main Topics  . Smoking status: Never Smoker   . Smokeless tobacco: Never Used  . Alcohol Use: Yes     Comment: occassionally  . Drug Use: No  . Sexual Activity: Not on file   Other  Topics Concern  . Not on file   Social History Narrative      Review of systems: Review of Systems  Constitutional: Negative for fever and chills.  HENT: Negative.   Eyes: Negative for blurred vision.  Respiratory: Negative for cough, shortness of breath and wheezing.   Cardiovascular: Negative for chest pain and palpitations.  Gastrointestinal: as per HPI Genitourinary: Negative for dysuria, urgency, frequency and hematuria.  Musculoskeletal: Negative for myalgias, back pain and joint pain.  Skin: Negative for itching and rash.  Neurological: Negative for dizziness, tremors, focal weakness, seizures and loss of consciousness.  Endo/Heme/Allergies: Negative for environmental allergies.  Psychiatric/Behavioral: Negative for depression, suicidal ideas and hallucinations.  All other systems reviewed and are negative.   Physical Exam: Filed Vitals:   10/02/15 1007  BP: 110/68  Pulse: 68   Gen:      No acute distress HEENT:  EOMI, sclera anicteric Neck:     No masses; no thyromegaly Lungs:    Clear to auscultation bilaterally; normal respiratory effort CV:         irregular rate and rhythm; no murmurs Abd:      + bowel sounds; soft, non-tender; no palpable masses, no distension Ext:    No edema; adequate peripheral perfusion Skin:      Warm and dry; no rash Neuro: alert and oriented x 3 Psych: normal mood and affect  Data Reviewed:  Reviewed chart in epic   Assessment and Plan/Recommendations:  75 year old female with history of chronic A. fib diagnosed in 2014 on anticoagulation with Xarelto here to establish care She is past due for screening colonoscopy for colorectal cancer We'll need to discuss with cardiology regarding holding Xarelto for 3 days prior to the procedure We will schedule for upper GI series to evaluate for possible hiatal hernia and also degree of reflux. Discussed antireflux measures. Patient is reluctant to start PPIs at this point.  We'll  schedule for hemorrhoidal band ligation after colonoscopy  K. Denzil Magnuson , MD [phone] Mon-Fri 8a-5p [phone] after 5p, weekends, holidays  CC: Leighton Ruff, MD

## 2015-10-02 NOTE — Telephone Encounter (Signed)
discontnue Rivaroxaban 36 hrs prior to procedure--last dose two nights prior to procedure

## 2015-10-10 ENCOUNTER — Ambulatory Visit (HOSPITAL_COMMUNITY): Payer: Commercial Managed Care - HMO

## 2016-02-08 ENCOUNTER — Telehealth: Payer: Self-pay | Admitting: *Deleted

## 2016-02-08 NOTE — Telephone Encounter (Signed)
-----   Message from Charlett Nose sent at 02/08/2016  3:54 PM EDT ----- Pt is requesting a personal call back from you.    Noted she verbalized she is not having any issues.

## 2016-02-08 NOTE — Telephone Encounter (Signed)
I called and spoke with the patient. She was requesting a follow up appointment with Dr. Suszanne Finch, NP for her a-fib. She has been dealing with settling her brother's estate and is now ready to address what is going on with her. She is willing to follow up with Roderic Palau, NP to discuss further her options for a-fib management. I advised the patient that I will forward to [place], RN for Roderic Palau to schedule a follow up appt for the patient.  She is agreeable.

## 2016-02-08 NOTE — Telephone Encounter (Signed)
Received staff message from a scheduler to contact patient.  Forwarding to Dr. Olin Pia nurse to follow up with patient.

## 2016-02-11 NOTE — Telephone Encounter (Signed)
I cld pt to sched an appt.  LMOM

## 2016-02-13 ENCOUNTER — Ambulatory Visit (HOSPITAL_COMMUNITY)
Admission: RE | Admit: 2016-02-13 | Discharge: 2016-02-13 | Disposition: A | Discharge status: Home / Self Care | Payer: Commercial Managed Care - HMO | Source: Ambulatory Visit | Attending: Nurse Practitioner | Admitting: Nurse Practitioner

## 2016-02-13 ENCOUNTER — Encounter (HOSPITAL_COMMUNITY): Payer: Self-pay | Admitting: Nurse Practitioner

## 2016-02-13 VITALS — BP 126/84 | HR 84 | Ht 62.5 in | Wt 209.2 lb

## 2016-02-13 DIAGNOSIS — I481 Persistent atrial fibrillation: Secondary | ICD-10-CM | POA: Insufficient documentation

## 2016-02-13 DIAGNOSIS — Z79899 Other long term (current) drug therapy: Secondary | ICD-10-CM | POA: Insufficient documentation

## 2016-02-13 DIAGNOSIS — R9431 Abnormal electrocardiogram [ECG] [EKG]: Secondary | ICD-10-CM | POA: Insufficient documentation

## 2016-02-13 DIAGNOSIS — Z7901 Long term (current) use of anticoagulants: Secondary | ICD-10-CM | POA: Insufficient documentation

## 2016-02-13 DIAGNOSIS — Z6837 Body mass index (BMI) 37.0-37.9, adult: Secondary | ICD-10-CM | POA: Diagnosis not present

## 2016-02-13 DIAGNOSIS — I4819 Other persistent atrial fibrillation: Secondary | ICD-10-CM

## 2016-02-13 NOTE — Patient Instructions (Signed)
Scheduler will be in touch with you to schedule appt with Dr. Caryl Comes

## 2016-02-13 NOTE — Progress Notes (Signed)
Primary Care Physician: Gerrit Heck, MD Primary Electrophysiologist: Dr Dewayne Shorter is a 75 y.o. female with a history of longstanding persistent atrial fibrillation who presents for follow up in the [facility].  Since last being seen in clinic, the patient reports doing reasonably well.  Today, she denies symptoms of chest pain, shortness of breath, orthopnea, PND, lower extremity edema, dizziness, presyncope, syncope, snoring, daytime somnolence, bleeding, or neurologic sequela. She has occasional palpitations.  The patient is tolerating medications without difficulties and is otherwise without complaint today.    Atrial Fibrillation Risk Factors:   she does not have a history of rheumatic fever.  she does not have a history of alcohol use.  she has a BMI of Body mass index is 37.65 kg/m.Marland Kitchen Filed Weights   02/13/16 1430  Weight: 209 lb 3.2 oz (94.9 kg)    LA size: last echo 2014   Atrial Fibrillation Management history:  Previous antiarrhythmic drugs: none  Previous cardioversions: 03/2013  Previous ablations: none  CHADS2VASC score: 2  Anticoagulation history: xarelto   Past Medical History:  Diagnosis Date  . Anemia   . Asthma   . Atrial fibrillation ([place])   . Cataracta   . Diverticulosis   . GERD (gastroesophageal reflux disease)   . HLD (hyperlipidemia)   . Hypothyroidism   . Internal hemorrhoids   . Osteopenia   . Vaginal polyp   . Vitamin D deficiency    Past Surgical History:  Procedure Laterality Date  . ABDOMINAL HYSTERECTOMY  05/28/81   oavarian cyst and appendix removed  . birth mark surgery  2/81, 1976  . BREAST LUMPECTOMY Left   . CARDIOVERSION N/A 03/25/2013   Procedure: CARDIOVERSION;  Surgeon: Darlin Coco, MD;  Location: [place];  Service: Cardiovascular;  Laterality: N/A;  . CATARACT EXTRACTION, BILATERAL    . CESAREAN SECTION  02/07/70  . DILATION AND CURETTAGE OF UTERUS   03/20/80  . KNEE ARTHROSCOPY Left 01/23/03  . LAPAROSCOPY  1979  . TONSILLECTOMY AND ADENOIDECTOMY    . TUBAL LIGATION      Current Outpatient Prescriptions  Medication Sig Dispense Refill  . Ca Carbonate-Mag Hydroxide (MYLANTA ULTRA PO) Take by mouth.    . calcium carbonate (TUMS - DOSED IN MG ELEMENTAL CALCIUM) 500 MG chewable tablet Chew 1 tablet by mouth daily.    . cholecalciferol (VITAMIN D) 1000 UNITS tablet Take 1,000 Units by mouth daily. Patient taking 4 caps (4000 units) daily by mouth.    . levothyroxine (SYNTHROID, LEVOTHROID) 50 MCG tablet Take 50 mcg by mouth daily before breakfast.    . metoprolol tartrate (LOPRESSOR) 25 MG tablet Take 1 tablet (25 mg total) by mouth 2 (two) times daily. 180 tablet 2  . rivaroxaban (XARELTO) 20 MG TABS tablet Take 1 tablet (20 mg total) by mouth daily with supper. 90 tablet 3  . verapamil (CALAN-SR) 120 MG CR tablet TAKE 1 TABLET BY MOUTH AT BEDTIME 90 tablet 2   No current facility-administered medications for this encounter.     Allergies  Allergen Reactions  . Diltiazem Hcl     IV was OK, oral med caused nausea and possibly diarrhea.  . Sulfonamide Derivatives     Unknown reaction, possible hives/sores    Social History   Social History  . Marital status: Divorced    Spouse name: N/A  . Number of children: 2  . Years of education: N/A   Occupational History  . retired  Social History Main Topics  . Smoking status: Never Smoker  . Smokeless tobacco: Never Used  . Alcohol use Yes     Comment: occassionally  . Drug use: No  . Sexual activity: Not on file   Other Topics Concern  . Not on file   Social History Narrative  . No narrative on file    Family History  Problem Relation Age of Onset  . Skin cancer Father   . Heart disease Mother   . Colon cancer Neg Hx     ROS- All systems are reviewed and negative except as per the HPI above.  Physical Exam: Vitals:   02/13/16 1430  BP: 126/84  Pulse: 84    Weight: 209 lb 3.2 oz (94.9 kg)  Height: 5' 2.5" (1.588 m)    GEN- The patient is well appearing, alert and oriented x 3 today.   Head- normocephalic, atraumatic Eyes-  Sclera clear, conjunctiva pink Ears- hearing intact Oropharynx- clear Neck- supple  Lungs- Clear to ausculation bilaterally, normal work of breathing Heart- Regular rate and rhythm, no murmurs, rubs or gallops  GI- soft, NT, ND, + BS Extremities- no clubbing, cyanosis, or edema MS- no significant deformity or atrophy Skin- no rash or lesion Psych- euthymic mood, full affect Neuro- strength and sensation are intact  Wt Readings from Last 3 Encounters:  02/13/16 209 lb 3.2 oz (94.9 kg)  10/02/15 206 lb (93.4 kg)  05/28/15 209 lb 6.4 oz (95 kg)    EKG today demonstrates afib, V rate 84 bpm, QTc 420 msec Echo 2014 reviewed  Epic records are reviewed at length today  Assessment and Plan:  1. Longstanding persistent atrial fibrillation She has been in AF since 2014.  She has had multiple conversations with Dr Caryl Comes and also with Butch Penny about rhythm control strategies but has not been willing to proceed with AAD therapy.  I worry that her delay has led to advanced atriopathy and that our ability to achieve/ maintain sinus is quite low.  Guidelines would suggest that she would require AAD therapy prior to ablation.  We did discuss tikosyn again today.  Currently she is not ready to proceed.  This leads me to believe that her symptoms are relatively mild.  Perhaps she will do best long term with rate control Continue xarelto for [facility] of 2. Update echo to evaluate EF, valvular function, and LA size  2. Morbid obesity Body mass index is 37.65 kg/m. As above, lifestyle modification was discussed at length including regular exercise and weight reduction.  I worry that she may not ultimately be willing to make necessary lifestyle change.  I have encouraged her to follow-up with Dr Caryl Comes in 3 months.  I will see as  needed.    Thompson Grayer, MD 02/13/2016 10:24 PM

## 2016-02-20 ENCOUNTER — Ambulatory Visit (HOSPITAL_COMMUNITY)
Admission: RE | Admit: 2016-02-20 | Discharge: 2016-02-20 | Disposition: A | Discharge status: Home / Self Care | Payer: Commercial Managed Care - HMO | Source: Ambulatory Visit | Attending: Internal Medicine | Admitting: Internal Medicine

## 2016-02-20 DIAGNOSIS — E785 Hyperlipidemia, unspecified: Secondary | ICD-10-CM | POA: Diagnosis not present

## 2016-02-20 DIAGNOSIS — I4819 Other persistent atrial fibrillation: Secondary | ICD-10-CM

## 2016-02-20 DIAGNOSIS — I340 Nonrheumatic mitral (valve) insufficiency: Secondary | ICD-10-CM | POA: Diagnosis not present

## 2016-02-20 DIAGNOSIS — I4891 Unspecified atrial fibrillation: Secondary | ICD-10-CM | POA: Diagnosis present

## 2016-02-20 DIAGNOSIS — I517 Cardiomegaly: Secondary | ICD-10-CM | POA: Insufficient documentation

## 2016-02-20 DIAGNOSIS — I481 Persistent atrial fibrillation: Secondary | ICD-10-CM | POA: Insufficient documentation

## 2016-02-20 NOTE — Progress Notes (Signed)
*  PRELIMINARY RESULTS* Echocardiogram 2D Echocardiogram has been performed.  Leavy Cella 02/20/2016, 4:01 PM

## 2016-03-21 ENCOUNTER — Ambulatory Visit: Payer: Commercial Managed Care - HMO | Admitting: Internal Medicine

## 2016-04-10 ENCOUNTER — Other Ambulatory Visit (HOSPITAL_COMMUNITY): Payer: Self-pay | Admitting: *Deleted

## 2016-04-10 MED ORDER — METOPROLOL TARTRATE 25 MG PO TABS: 25.0000 mg | ORAL_TABLET | Freq: Two times a day (BID) | ORAL | 2 refills | Status: DC

## 2016-04-16 ENCOUNTER — Ambulatory Visit (HOSPITAL_COMMUNITY)
Admission: RE | Admit: 2016-04-16 | Discharge: 2016-04-16 | Disposition: A | Discharge status: Home / Self Care | Payer: Commercial Managed Care - HMO | Source: Ambulatory Visit | Attending: Nurse Practitioner | Admitting: Nurse Practitioner

## 2016-04-16 ENCOUNTER — Encounter (HOSPITAL_COMMUNITY): Payer: Self-pay | Admitting: Nurse Practitioner

## 2016-04-16 VITALS — BP 136/82 | HR 103 | Ht 62.5 in | Wt 212.0 lb

## 2016-04-16 DIAGNOSIS — E785 Hyperlipidemia, unspecified: Secondary | ICD-10-CM | POA: Insufficient documentation

## 2016-04-16 DIAGNOSIS — M8580 Other specified disorders of bone density and structure, unspecified site: Secondary | ICD-10-CM | POA: Insufficient documentation

## 2016-04-16 DIAGNOSIS — Z8249 Family history of ischemic heart disease and other diseases of the circulatory system: Secondary | ICD-10-CM | POA: Insufficient documentation

## 2016-04-16 DIAGNOSIS — I482 Chronic atrial fibrillation, unspecified: Secondary | ICD-10-CM

## 2016-04-16 DIAGNOSIS — Z880 Allergy status to penicillin: Secondary | ICD-10-CM | POA: Diagnosis not present

## 2016-04-16 DIAGNOSIS — Z79899 Other long term (current) drug therapy: Secondary | ICD-10-CM | POA: Diagnosis not present

## 2016-04-16 DIAGNOSIS — E559 Vitamin D deficiency, unspecified: Secondary | ICD-10-CM | POA: Diagnosis not present

## 2016-04-16 DIAGNOSIS — Z7901 Long term (current) use of anticoagulants: Secondary | ICD-10-CM | POA: Diagnosis not present

## 2016-04-16 DIAGNOSIS — Z882 Allergy status to sulfonamides status: Secondary | ICD-10-CM | POA: Diagnosis not present

## 2016-04-16 DIAGNOSIS — E039 Hypothyroidism, unspecified: Secondary | ICD-10-CM | POA: Diagnosis not present

## 2016-04-16 DIAGNOSIS — K219 Gastro-esophageal reflux disease without esophagitis: Secondary | ICD-10-CM | POA: Diagnosis not present

## 2016-04-16 NOTE — Progress Notes (Signed)
Primary Care Physician: Janice Heck, MD Referring Physician: Dr. Celine David Janice David is a 75 y.o. female with a h/o chronic afib that is in the afib clinic for f/u. Pt requested the appointment to discuss recent echo and options for her afib. Echo was wnl and pt was reassured. Also discussed options for afib but pt is still indecisive and has been in chronic afib since 2014 and none of the options may benefit pt at this point. She saw Dr. Rayann David in October and he said that she was not an ablation candidate.  He thought at this point pt may be best rate controlled.  Today, she denies symptoms of palpitations, chest pain, orthopnea, PND, lower extremity edema, dizziness, presyncope, syncope, or neurologic sequela. Chronic shortness of breath. The patient is tolerating medications without difficulties and is otherwise without complaint today.   Past Medical History:  Diagnosis Date  . Anemia   . Asthma   . Atrial fibrillation ([place])   . Cataracta   . Diverticulosis   . GERD (gastroesophageal reflux disease)   . HLD (hyperlipidemia)   . Hypothyroidism   . Internal hemorrhoids   . Osteopenia   . Vaginal polyp   . Vitamin D deficiency    Past Surgical History:  Procedure Laterality Date  . ABDOMINAL HYSTERECTOMY  05/28/81   oavarian cyst and appendix removed  . birth mark surgery  2/81, 1976  . BREAST LUMPECTOMY Left   . CARDIOVERSION N/A 03/25/2013   Procedure: CARDIOVERSION;  Surgeon: Janice Coco, MD;  Location: [place];  Service: Cardiovascular;  Laterality: N/A;  . CATARACT EXTRACTION, BILATERAL    . CESAREAN SECTION  02/07/70  . DILATION AND CURETTAGE OF UTERUS  03/20/80  . KNEE ARTHROSCOPY Left 01/23/03  . LAPAROSCOPY  1979  . TONSILLECTOMY AND ADENOIDECTOMY    . TUBAL LIGATION      Current Outpatient Prescriptions  Medication Sig Dispense Refill  . cholecalciferol (VITAMIN D) 1000 UNITS tablet Take 1,000 Units by mouth daily. Patient taking 4 caps  (4000 units) daily by mouth.    . levothyroxine (SYNTHROID, LEVOTHROID) 50 MCG tablet Take 50 mcg by mouth daily before breakfast.    . metoprolol tartrate (LOPRESSOR) 25 MG tablet Take 1 tablet (25 mg total) by mouth 2 (two) times daily. 180 tablet 2  . rivaroxaban (XARELTO) 20 MG TABS tablet Take 1 tablet (20 mg total) by mouth daily with supper. 90 tablet 3  . verapamil (CALAN-SR) 120 MG CR tablet TAKE 1 TABLET BY MOUTH AT BEDTIME 90 tablet 2   No current facility-administered medications for this encounter.     Allergies  Allergen Reactions  . Diltiazem Hcl     IV was OK, oral med caused nausea and possibly diarrhea.  . Sulfonamide Derivatives     Unknown reaction, possible hives/sores    Social History   Social History  . Marital status: Divorced    Spouse name: N/A  . Number of children: 2  . Years of education: N/A   Occupational History  . retired    Social History Main Topics  . Smoking status: Never Smoker  . Smokeless tobacco: Never Used  . Alcohol use Yes     Comment: occassionally  . Drug use: No  . Sexual activity: Not on file   Other Topics Concern  . Not on file   Social History Narrative  . No narrative on file    Family History  Problem Relation Age of Onset  .  Skin cancer Father   . Heart disease Mother   . Colon cancer Neg Hx     ROS- All systems are reviewed and negative except as per the HPI above  Physical Exam: Vitals:   04/16/16 1349  BP: 136/82  Pulse: (!) 103  Weight: 212 lb (96.2 kg)  Height: 5' 2.5" (1.588 m)   Wt Readings from Last 3 Encounters:  04/16/16 212 lb (96.2 kg)  02/13/16 209 lb 3.2 oz (94.9 kg)  10/02/15 206 lb (93.4 kg)    Labs: Lab Results  Component Value Date   NA 133 (L) 03/22/2013   K 4.3 03/22/2013   CL 99 03/22/2013   CO2 28 03/22/2013   GLUCOSE 95 03/22/2013   BUN 13 03/22/2013   CREATININE 1.0 03/22/2013   CALCIUM 10.1 03/22/2013   MG 2.3 02/10/2013   Lab Results  Component Value Date    INR 1.10 02/09/2013   Lab Results  Component Value Date   CHOL 177 02/10/2013   HDL 65 02/10/2013   LDLCALC 93 02/10/2013   TRIG 95 02/10/2013     GEN- The patient is well appearing, alert and oriented x 3 today.   Head- normocephalic, atraumatic Eyes-  Sclera clear, conjunctiva pink Ears- hearing intact Oropharynx- clear Neck- supple, no JVP Lymph- no cervical lymphadenopathy Lungs- Clear to ausculation bilaterally, normal work of breathing Heart- irregular rate and rhythm, no murmurs, rubs or gallops, PMI not laterally displaced GI- soft, NT, ND, + BS Extremities- no clubbing, cyanosis, or edema MS- no significant deformity or atrophy Skin- no rash or lesion Psych- euthymic mood, full affect Neuro- strength and sensation are intact  EKG-not done Echo-Study Conclusions  - Left ventricle: The cavity size was normal. Wall thickness was   normal. Systolic function was normal. The estimated ejection   fraction was in the range of 50% to 55%. Wall motion was normal;   there were no regional wall motion abnormalities. - Mitral valve: Calcified annulus. - Left atrium: The atrium was mildly dilated. - Right atrium: The atrium was mildly dilated.  Impressions:  - Normal LV function; mild biatrial enlargement; trace MR and TR.     Assessment and Plan: 1. Chronic afib Pt has been given options to restore SR with Dr. Caryl David , Dr. Rayann David and myself with these conversations ongoing since 2014. Pt has never been able to commit and now being in afib so long may undermine ability to restore SR long term. Dr. Rayann David on last appointment thought at this point rate control is best option. She still wants to think about tikosyn, knowing it may not work, but will let me know if she would commit to this. Continue rate control with CCB, BB.  Continue xarelto with chadsvasc score of at least 2.   Janice David, [facility] [address] [place], [place] [address] [phone]

## 2016-05-13 ENCOUNTER — Other Ambulatory Visit (HOSPITAL_COMMUNITY): Payer: Self-pay | Admitting: *Deleted

## 2016-05-13 MED ORDER — VERAPAMIL HCL ER 120 MG PO TBCR: EXTENDED_RELEASE_TABLET | ORAL | 2 refills | Status: DC

## 2016-05-13 MED ORDER — RIVAROXABAN 20 MG PO TABS: 20.0000 mg | ORAL_TABLET | Freq: Every day | ORAL | 3 refills | Status: DC

## 2016-05-15 DIAGNOSIS — R5383 Other fatigue: Secondary | ICD-10-CM | POA: Diagnosis not present

## 2016-05-15 DIAGNOSIS — M8588 Other specified disorders of bone density and structure, other site: Secondary | ICD-10-CM | POA: Diagnosis not present

## 2016-05-15 DIAGNOSIS — Z0000 Encounter for general adult medical examination without abnormal findings: Secondary | ICD-10-CM | POA: Diagnosis not present

## 2016-05-15 DIAGNOSIS — Z1389 Encounter for screening for other disorder: Secondary | ICD-10-CM | POA: Diagnosis not present

## 2016-05-15 DIAGNOSIS — K219 Gastro-esophageal reflux disease without esophagitis: Secondary | ICD-10-CM | POA: Diagnosis not present

## 2016-05-15 DIAGNOSIS — N6459 Other signs and symptoms in breast: Secondary | ICD-10-CM | POA: Diagnosis not present

## 2016-05-15 DIAGNOSIS — E039 Hypothyroidism, unspecified: Secondary | ICD-10-CM | POA: Diagnosis not present

## 2016-05-15 DIAGNOSIS — E559 Vitamin D deficiency, unspecified: Secondary | ICD-10-CM | POA: Diagnosis not present

## 2016-05-15 DIAGNOSIS — I4891 Unspecified atrial fibrillation: Secondary | ICD-10-CM | POA: Diagnosis not present

## 2016-05-15 DIAGNOSIS — E7800 Pure hypercholesterolemia, unspecified: Secondary | ICD-10-CM | POA: Diagnosis not present

## 2016-05-15 DIAGNOSIS — F419 Anxiety disorder, unspecified: Secondary | ICD-10-CM | POA: Diagnosis not present

## 2016-05-15 DIAGNOSIS — N842 Polyp of vagina: Secondary | ICD-10-CM | POA: Diagnosis not present

## 2016-05-16 ENCOUNTER — Other Ambulatory Visit: Payer: Self-pay | Admitting: Family Medicine

## 2016-05-16 DIAGNOSIS — N6459 Other signs and symptoms in breast: Secondary | ICD-10-CM

## 2016-05-22 ENCOUNTER — Ambulatory Visit: Payer: Commercial Managed Care - HMO | Admitting: Internal Medicine

## 2016-05-29 ENCOUNTER — Ambulatory Visit
Admission: RE | Admit: 2016-05-29 | Discharge: 2016-05-29 | Disposition: A | Discharge status: Home / Self Care | Payer: PPO | Source: Ambulatory Visit | Attending: Family Medicine | Admitting: Family Medicine

## 2016-05-29 DIAGNOSIS — N644 Mastodynia: Secondary | ICD-10-CM | POA: Diagnosis not present

## 2016-05-29 DIAGNOSIS — N6459 Other signs and symptoms in breast: Secondary | ICD-10-CM

## 2016-06-26 DIAGNOSIS — Z85828 Personal history of other malignant neoplasm of skin: Secondary | ICD-10-CM | POA: Diagnosis not present

## 2016-06-26 DIAGNOSIS — C44619 Basal cell carcinoma of skin of left upper limb, including shoulder: Secondary | ICD-10-CM | POA: Diagnosis not present

## 2016-06-26 DIAGNOSIS — L821 Other seborrheic keratosis: Secondary | ICD-10-CM | POA: Diagnosis not present

## 2016-06-26 DIAGNOSIS — D485 Neoplasm of uncertain behavior of skin: Secondary | ICD-10-CM | POA: Diagnosis not present

## 2016-06-26 DIAGNOSIS — L570 Actinic keratosis: Secondary | ICD-10-CM | POA: Diagnosis not present

## 2016-06-26 DIAGNOSIS — L438 Other lichen planus: Secondary | ICD-10-CM | POA: Diagnosis not present

## 2016-06-26 DIAGNOSIS — L812 Freckles: Secondary | ICD-10-CM | POA: Diagnosis not present

## 2016-07-02 DIAGNOSIS — D519 Vitamin B12 deficiency anemia, unspecified: Secondary | ICD-10-CM | POA: Diagnosis not present

## 2016-07-02 DIAGNOSIS — E559 Vitamin D deficiency, unspecified: Secondary | ICD-10-CM | POA: Diagnosis not present

## 2016-07-02 DIAGNOSIS — E039 Hypothyroidism, unspecified: Secondary | ICD-10-CM | POA: Diagnosis not present

## 2016-07-02 DIAGNOSIS — I10 Essential (primary) hypertension: Secondary | ICD-10-CM | POA: Diagnosis not present

## 2016-07-10 DIAGNOSIS — N842 Polyp of vagina: Secondary | ICD-10-CM | POA: Diagnosis not present

## 2016-07-10 DIAGNOSIS — Z01411 Encounter for gynecological examination (general) (routine) with abnormal findings: Secondary | ICD-10-CM | POA: Diagnosis not present

## 2016-07-24 ENCOUNTER — Other Ambulatory Visit (HOSPITAL_COMMUNITY): Payer: Self-pay | Admitting: *Deleted

## 2016-07-24 DIAGNOSIS — I482 Chronic atrial fibrillation, unspecified: Secondary | ICD-10-CM

## 2016-07-24 MED ORDER — RIVAROXABAN 20 MG PO TABS: 20.0000 mg | ORAL_TABLET | Freq: Every day | ORAL | 3 refills | Status: DC

## 2016-08-05 DIAGNOSIS — E039 Hypothyroidism, unspecified: Secondary | ICD-10-CM | POA: Diagnosis not present

## 2016-09-15 DIAGNOSIS — K649 Unspecified hemorrhoids: Secondary | ICD-10-CM | POA: Diagnosis not present

## 2016-09-15 DIAGNOSIS — K529 Noninfective gastroenteritis and colitis, unspecified: Secondary | ICD-10-CM | POA: Diagnosis not present

## 2016-09-15 DIAGNOSIS — J329 Chronic sinusitis, unspecified: Secondary | ICD-10-CM | POA: Diagnosis not present

## 2016-11-04 ENCOUNTER — Other Ambulatory Visit: Payer: Self-pay | Admitting: *Deleted

## 2016-11-04 NOTE — Patient Outreach (Signed)
[place] [facility]) Care Management  11/04/2016  Janice David 1941/05/02 [id number]   Member identified as high risk according to [employer] health questionnaire.  Call placed to introduce [facility] care management services and perform telephone screening.  No answer, unable to leave voice message.  Will follow up within the next week.  [name], [place], MSN [place] [phone]

## 2016-11-07 ENCOUNTER — Other Ambulatory Visit: Payer: Self-pay | Admitting: *Deleted

## 2016-11-07 NOTE — Patient Outreach (Signed)
[place] [facility]) Care Management  11/07/2016  Janice David 25-Sep-1940 [id number]    2nd attempt made to call placed to introduce [facility] care management services and perform telephone screening.  No answer, HIPAA compliant voice message left.  Will await call back, if no call back, will follow up within the next week.  [name], [place], MSN [place] [phone]

## 2016-11-10 ENCOUNTER — Other Ambulatory Visit: Payer: Self-pay | Admitting: *Deleted

## 2016-11-10 NOTE — Patient Outreach (Signed)
[place] [facility]) Care Management  11/10/2016  KEIRAH KONITZER 23-Sep-1940 [id number]   3rd attempt made to call placed to introduce [facility] care management services and perform telephone screening.  No answer, HIPAA compliant voice message left.  Will await call back.  Will send outreach letter and [facility] information packet in mail.  If call back received, will perform screening at that time.  [name], [place], MSN [place] [phone]

## 2016-11-11 ENCOUNTER — Encounter: Payer: Self-pay | Admitting: *Deleted

## 2016-12-05 DIAGNOSIS — I8311 Varicose veins of right lower extremity with inflammation: Secondary | ICD-10-CM | POA: Diagnosis not present

## 2016-12-05 DIAGNOSIS — Z85828 Personal history of other malignant neoplasm of skin: Secondary | ICD-10-CM | POA: Diagnosis not present

## 2016-12-05 DIAGNOSIS — I8312 Varicose veins of left lower extremity with inflammation: Secondary | ICD-10-CM | POA: Diagnosis not present

## 2016-12-05 DIAGNOSIS — I872 Venous insufficiency (chronic) (peripheral): Secondary | ICD-10-CM | POA: Diagnosis not present

## 2016-12-05 DIAGNOSIS — L723 Sebaceous cyst: Secondary | ICD-10-CM | POA: Diagnosis not present

## 2016-12-05 DIAGNOSIS — L821 Other seborrheic keratosis: Secondary | ICD-10-CM | POA: Diagnosis not present

## 2017-01-21 ENCOUNTER — Other Ambulatory Visit (HOSPITAL_COMMUNITY): Payer: Self-pay | Admitting: Nurse Practitioner

## 2017-02-06 DIAGNOSIS — Z85828 Personal history of other malignant neoplasm of skin: Secondary | ICD-10-CM | POA: Diagnosis not present

## 2017-02-06 DIAGNOSIS — L821 Other seborrheic keratosis: Secondary | ICD-10-CM | POA: Diagnosis not present

## 2017-02-06 DIAGNOSIS — L570 Actinic keratosis: Secondary | ICD-10-CM | POA: Diagnosis not present

## 2017-02-06 DIAGNOSIS — L84 Corns and callosities: Secondary | ICD-10-CM | POA: Diagnosis not present

## 2017-04-09 ENCOUNTER — Ambulatory Visit: Payer: PPO | Admitting: Podiatry

## 2017-04-13 ENCOUNTER — Ambulatory Visit: Payer: PPO | Admitting: Podiatry

## 2017-04-16 ENCOUNTER — Ambulatory Visit: Payer: PPO | Admitting: Podiatry

## 2017-04-22 ENCOUNTER — Encounter: Payer: Self-pay | Admitting: Podiatry

## 2017-04-22 ENCOUNTER — Ambulatory Visit: Payer: PPO | Admitting: Podiatry

## 2017-04-22 DIAGNOSIS — D689 Coagulation defect, unspecified: Secondary | ICD-10-CM | POA: Diagnosis not present

## 2017-04-22 DIAGNOSIS — M779 Enthesopathy, unspecified: Secondary | ICD-10-CM | POA: Diagnosis not present

## 2017-04-22 DIAGNOSIS — Q828 Other specified congenital malformations of skin: Secondary | ICD-10-CM

## 2017-04-22 MED ORDER — TRIAMCINOLONE ACETONIDE 10 MG/ML IJ SUSP
10.0000 mg | Freq: Once | INTRAMUSCULAR | Status: AC
  Administered 2017-04-22: 10 mg

## 2017-04-22 NOTE — Progress Notes (Signed)
   Subjective:    Patient ID: Janice David, female    DOB: 03-03-1941, 76 y.o.   MRN: [id number]  HPI    Review of Systems  All other systems reviewed and are negative.      Objective:   Physical Exam        Assessment & Plan:

## 2017-04-22 NOTE — Progress Notes (Signed)
Subjective:   Patient ID: Janice David, female   DOB: 76 y.o.   MRN: [id number]   HPI Patient presents stating that that is very painful lesion on her left fifth metatarsal right and I get these chronic lesions on both feet that I cannot take care of myself.  Patient is on blood thinner due to A. fib   Review of Systems  All other systems reviewed and are negative.       Objective:  Physical Exam  Constitutional: She appears well-developed and well-nourished.  Cardiovascular: Intact distal pulses.  Pulmonary/Chest: Effort normal.  Musculoskeletal: Normal range of motion.  Neurological: She is alert.  Skin: Skin is warm.  Nursing note and vitals reviewed.   Neurovascular status is intact muscle strength is adequate range of motion within normal limits.  Patient is noted to have keratotic lesion with inflammation fluid around the fifth MPJ right that is painful when pressed with numerous keratotic lesions on the plantar aspect of both feet with lucent type course.  Patient is noted to have good digital perfusion and is well oriented x3     Assessment:  Inflamed fifth MPJ right with proximal fluid buildup along with keratotic lesion sub-part of the right and several lesions on both feet that are painful     Plan:  H&P condition reviewed and careful capsular injection administered right 3 mg Kenalog 5 mg Xylocaine and debrided all lesions with sharp sterile instrumentation.  Patient be seen back to recheck

## 2017-04-24 ENCOUNTER — Encounter: Payer: Self-pay | Admitting: Internal Medicine

## 2017-04-24 ENCOUNTER — Ambulatory Visit: Payer: PPO | Admitting: Internal Medicine

## 2017-04-24 VITALS — BP 136/87 | HR 84 | Ht 62.5 in | Wt 212.6 lb

## 2017-04-24 DIAGNOSIS — I481 Persistent atrial fibrillation: Secondary | ICD-10-CM

## 2017-04-24 DIAGNOSIS — I5030 Unspecified diastolic (congestive) heart failure: Secondary | ICD-10-CM

## 2017-04-24 DIAGNOSIS — I4819 Other persistent atrial fibrillation: Secondary | ICD-10-CM

## 2017-04-24 LAB — CBC
HEMATOCRIT: 42.1 % (ref 34.0–46.6)
Hemoglobin: 14.6 g/dL (ref 11.1–15.9)
MCH: 33.3 pg — ABNORMAL HIGH (ref 26.6–33.0)
MCHC: 34.7 g/dL (ref 31.5–35.7)
MCV: 96 fL (ref 79–97)
PLATELETS: 372 10*3/uL (ref 150–379)
RBC: 4.39 x10E6/uL (ref 3.77–5.28)
RDW: 12.9 % (ref 12.3–15.4)
WBC: 8.4 10*3/uL (ref 3.4–10.8)

## 2017-04-24 LAB — COMPREHENSIVE METABOLIC PANEL
A/G RATIO: 1.6 (ref 1.2–2.2)
ALT: 26 IU/L (ref 0–32)
AST: 30 IU/L (ref 0–40)
Albumin: 4.4 g/dL (ref 3.5–4.8)
Alkaline Phosphatase: 70 IU/L (ref 39–117)
BUN/Creatinine Ratio: 17 (ref 12–28)
BUN: 13 mg/dL (ref 8–27)
Bilirubin Total: 0.3 mg/dL (ref 0.0–1.2)
CALCIUM: 10.6 mg/dL — AB (ref 8.7–10.3)
CO2: 25 mmol/L (ref 20–29)
CREATININE: 0.75 mg/dL (ref 0.57–1.00)
Chloride: 95 mmol/L — ABNORMAL LOW (ref 96–106)
GFR, EST AFRICAN AMERICAN: 90 mL/min/{1.73_m2} (ref >59)
GFR, EST NON AFRICAN AMERICAN: 78 mL/min/{1.73_m2} (ref >59)
Globulin, Total: 2.7 g/dL (ref 1.5–4.5)
Glucose: 88 mg/dL (ref 65–99)
Potassium: 5.1 mmol/L (ref 3.5–5.2)
Sodium: 132 mmol/L — ABNORMAL LOW (ref 134–144)
TOTAL PROTEIN: 7.1 g/dL (ref 6.0–8.5)

## 2017-04-24 LAB — TSH: TSH: 3.21 u[IU]/mL (ref 0.450–4.500)

## 2017-04-24 NOTE — Patient Instructions (Addendum)
Medication Instructions:  Your physician recommends that you continue on your current medications as directed. Please refer to the Current Medication list given to you today.   Labwork: TODAY: CBC, CMET, TSH  Testing/Procedures: None ordered  Follow-Up: Your physician wants you to follow-up in: 1 year with Roderic Palau, NP. You will receive a reminder letter in the mail two months in advance. If you don't receive a letter, please call our office to schedule the follow-up appointment.   Any Other Special Instructions Will Be Listed Below (If Applicable).     If you need a refill on your cardiac medications before your next appointment, please call your pharmacy.

## 2017-04-24 NOTE — Progress Notes (Signed)
Patient Care Team: Leighton Ruff, MD as PCP - General (Family Medicine)   HPI  Janice David is a 76 y.o. female Seen With Chief Complaint of atrial fibrillation.  She was noted in the spring 2015 to have atrial fibrillation during GI evaluation. She saw Dr. Greggory Brandy who noted that she had persistent atrial fibrillation with well controlled rates. It was elected to maintain her on anticoagulation and treat her with rate control  Cardioversion is been discussed on multiple occasions.  She has been disinclined.  It has been resolved by passive decision making to pursue rate control.      DATE TEST    10/14 Echo    EF 55-60 %   10/17 Echo    EF 55-60 %         Thromboembolic risk profile is notable for age-50; gender-1;  her CHADS-VASc score is 2  No bleeding  Aware of her Heart beat at night   No edema but increasing SOB;  Diet is salt replete ;(  No recent blood work   Date Cr Hgb               Past Medical History:  Diagnosis Date  . Anemia   . Asthma   . Atrial fibrillation ([place])   . Cataracta   . Diverticulosis   . GERD (gastroesophageal reflux disease)   . HLD (hyperlipidemia)   . Hypothyroidism   . Internal hemorrhoids   . Osteopenia   . Vaginal polyp   . Vitamin D deficiency     Past Surgical History:  Procedure Laterality Date  . ABDOMINAL HYSTERECTOMY  05/28/81   oavarian cyst and appendix removed  . birth mark surgery  2/81, 1976  . BREAST LUMPECTOMY Left   . CARDIOVERSION N/A 03/25/2013   Procedure: CARDIOVERSION;  Surgeon: Darlin Coco, MD;  Location: [place];  Service: Cardiovascular;  Laterality: N/A;  . CATARACT EXTRACTION, BILATERAL    . CESAREAN SECTION  02/07/70  . DILATION AND CURETTAGE OF UTERUS  03/20/80  . KNEE ARTHROSCOPY Left 01/23/03  . LAPAROSCOPY  1979  . TONSILLECTOMY AND ADENOIDECTOMY    . TUBAL LIGATION      Current Outpatient Medications  Medication Sig Dispense Refill  . cholecalciferol (VITAMIN D) 1000  UNITS tablet Take 1,000 Units by mouth daily. Patient taking 4 caps (4000 units) daily by mouth.    . levothyroxine (SYNTHROID, LEVOTHROID) 50 MCG tablet Take 50 mcg by mouth daily before breakfast.    . metoprolol tartrate (LOPRESSOR) 25 MG tablet TAKE ONE TABLET BY MOUTH TWICE DAILY 180 tablet 2  . rivaroxaban (XARELTO) 20 MG TABS tablet Take 1 tablet (20 mg total) by mouth daily with supper. 90 tablet 3  . verapamil (CALAN-SR) 120 MG CR tablet TAKE ONE TABLET BY MOUTH AT BEDTIME 90 tablet 2   No current facility-administered medications for this visit.     Allergies  Allergen Reactions  . Diltiazem Hcl     IV was OK, oral med caused nausea and possibly diarrhea.  . Sulfonamide Derivatives     Unknown reaction, possible hives/sores    Review of Systems negative except from HPI and PMH  Physical Exam BP 136/87   Pulse 84   Ht 5' 2.5" (1.588 m)   Wt 212 lb 9.6 oz (96.4 kg)   BMI 38.27 kg/m  Well developed and nourished in no acute distress HENT normal Neck supple with JVP-9 + HJR Carotids brisk and full without bruits  Clear Irregularly irregular rate and rhythm with controlled ventricular response, no murmurs or gallops Abd-soft with active BS without hepatomegaly No Clubbing cyanosis tr edema Skin-warm and dry A & Oriented  Grossly normal sensory and motor function    ECG demonstrates atrial fibrillation at a rate of 84 -/09/35  Assessment and  Plan  Atrial fibrillation-persistent with a controlled ventricular response  Exercise intolerance  Sleep-disordered breathing  Hypertension    Blood pressure is reasonably controlled  She has not consummated a sleep study.  No bleeding on anticoagulation.  Last GFR available is 2014 it was 72.  We will recheck labs today.  She is volume overloaded.  I suggested that she take the diuretic she has at home for 3 days.  We will give her information on decreasing sodium intake.

## 2017-05-04 ENCOUNTER — Telehealth: Payer: Self-pay

## 2017-05-04 NOTE — Telephone Encounter (Signed)
lmtcb for lab results

## 2017-05-07 ENCOUNTER — Telehealth: Payer: Self-pay

## 2017-05-07 ENCOUNTER — Telehealth: Payer: Self-pay | Admitting: Internal Medicine

## 2017-05-07 NOTE — Telephone Encounter (Signed)
F/U  Patient returning call regarding her lab results.  Please call tomorrow--she is traveling today and may not be able to answer her phone.

## 2017-05-07 NOTE — Telephone Encounter (Signed)
lmtcb for lab results

## 2017-05-08 ENCOUNTER — Ambulatory Visit: Payer: PPO | Admitting: Internal Medicine

## 2017-05-08 NOTE — Telephone Encounter (Signed)
Pt is aware and agreeable to results Pt is agreeable to come on 1/9 for Ionized Calcium.

## 2017-05-13 ENCOUNTER — Other Ambulatory Visit: Payer: PPO | Admitting: *Deleted

## 2017-05-14 ENCOUNTER — Other Ambulatory Visit: Payer: Self-pay | Admitting: Family Medicine

## 2017-05-14 DIAGNOSIS — Z1231 Encounter for screening mammogram for malignant neoplasm of breast: Secondary | ICD-10-CM

## 2017-05-14 LAB — CALCIUM, IONIZED: CALCIUM ION: 5.3 mg/dL (ref 4.5–5.6)

## 2017-05-15 DIAGNOSIS — H35373 Puckering of macula, bilateral: Secondary | ICD-10-CM | POA: Diagnosis not present

## 2017-05-15 DIAGNOSIS — Z961 Presence of intraocular lens: Secondary | ICD-10-CM | POA: Diagnosis not present

## 2017-05-15 DIAGNOSIS — H04121 Dry eye syndrome of right lacrimal gland: Secondary | ICD-10-CM | POA: Diagnosis not present

## 2017-05-15 DIAGNOSIS — I10 Essential (primary) hypertension: Secondary | ICD-10-CM | POA: Diagnosis not present

## 2017-05-21 DIAGNOSIS — K219 Gastro-esophageal reflux disease without esophagitis: Secondary | ICD-10-CM | POA: Diagnosis not present

## 2017-05-21 DIAGNOSIS — E559 Vitamin D deficiency, unspecified: Secondary | ICD-10-CM | POA: Diagnosis not present

## 2017-05-21 DIAGNOSIS — E7800 Pure hypercholesterolemia, unspecified: Secondary | ICD-10-CM | POA: Diagnosis not present

## 2017-05-21 DIAGNOSIS — M8588 Other specified disorders of bone density and structure, other site: Secondary | ICD-10-CM | POA: Diagnosis not present

## 2017-05-21 DIAGNOSIS — E039 Hypothyroidism, unspecified: Secondary | ICD-10-CM | POA: Diagnosis not present

## 2017-05-21 DIAGNOSIS — Z1389 Encounter for screening for other disorder: Secondary | ICD-10-CM | POA: Diagnosis not present

## 2017-05-21 DIAGNOSIS — Z0000 Encounter for general adult medical examination without abnormal findings: Secondary | ICD-10-CM | POA: Diagnosis not present

## 2017-05-21 DIAGNOSIS — I4891 Unspecified atrial fibrillation: Secondary | ICD-10-CM | POA: Diagnosis not present

## 2017-05-21 DIAGNOSIS — Z1211 Encounter for screening for malignant neoplasm of colon: Secondary | ICD-10-CM | POA: Diagnosis not present

## 2017-05-21 DIAGNOSIS — F419 Anxiety disorder, unspecified: Secondary | ICD-10-CM | POA: Diagnosis not present

## 2017-05-21 DIAGNOSIS — Z7901 Long term (current) use of anticoagulants: Secondary | ICD-10-CM | POA: Diagnosis not present

## 2017-06-03 ENCOUNTER — Ambulatory Visit
Admission: RE | Admit: 2017-06-03 | Discharge: 2017-06-03 | Disposition: A | Discharge status: Home / Self Care | Payer: PPO | Source: Ambulatory Visit | Attending: Family Medicine | Admitting: Family Medicine

## 2017-06-03 DIAGNOSIS — Z1231 Encounter for screening mammogram for malignant neoplasm of breast: Secondary | ICD-10-CM | POA: Diagnosis not present

## 2017-06-11 ENCOUNTER — Ambulatory Visit: Payer: PPO | Admitting: Podiatry

## 2017-06-11 ENCOUNTER — Encounter: Payer: Self-pay | Admitting: Podiatry

## 2017-06-11 DIAGNOSIS — Q828 Other specified congenital malformations of skin: Secondary | ICD-10-CM

## 2017-06-11 NOTE — Progress Notes (Signed)
Subjective:   Patient ID: Janice David, female   DOB: 77 y.o.   MRN: [id number]   HPI Patient presents with a nucleated keratotic lesions plantar aspect of both feet that are painful when pressed with nucleated cores that are sore   ROS      Objective:  Physical Exam  Neurovascular status intact with keratotic lesions plantar aspect both feet with porokeratotic lesions with approximate 5 lesions on the left and 6 lesions on the right     Assessment:  Chronic porokeratotic lesions plantar aspect bilateral     Plan:  Sterile debridement of all lesions accomplished with no iatrogenic bleeding and reappoint for routine care as needed

## 2017-06-11 NOTE — Patient Instructions (Signed)

## 2017-07-10 DIAGNOSIS — Z1211 Encounter for screening for malignant neoplasm of colon: Secondary | ICD-10-CM | POA: Diagnosis not present

## 2017-07-24 DIAGNOSIS — Z85828 Personal history of other malignant neoplasm of skin: Secondary | ICD-10-CM | POA: Diagnosis not present

## 2017-07-24 DIAGNOSIS — D1801 Hemangioma of skin and subcutaneous tissue: Secondary | ICD-10-CM | POA: Diagnosis not present

## 2017-07-24 DIAGNOSIS — L570 Actinic keratosis: Secondary | ICD-10-CM | POA: Diagnosis not present

## 2017-07-24 DIAGNOSIS — L812 Freckles: Secondary | ICD-10-CM | POA: Diagnosis not present

## 2017-07-24 DIAGNOSIS — D225 Melanocytic nevi of trunk: Secondary | ICD-10-CM | POA: Diagnosis not present

## 2017-07-24 DIAGNOSIS — L821 Other seborrheic keratosis: Secondary | ICD-10-CM | POA: Diagnosis not present

## 2017-08-31 ENCOUNTER — Other Ambulatory Visit (HOSPITAL_COMMUNITY): Payer: Self-pay | Admitting: *Deleted

## 2017-08-31 MED ORDER — RIVAROXABAN 20 MG PO TABS: 20.0000 mg | ORAL_TABLET | Freq: Every day | ORAL | 3 refills | Status: DC

## 2017-10-26 DIAGNOSIS — M8589 Other specified disorders of bone density and structure, multiple sites: Secondary | ICD-10-CM | POA: Diagnosis not present

## 2017-10-28 ENCOUNTER — Other Ambulatory Visit (HOSPITAL_COMMUNITY): Payer: Self-pay | Admitting: Nurse Practitioner

## 2017-12-04 DIAGNOSIS — E7800 Pure hypercholesterolemia, unspecified: Secondary | ICD-10-CM | POA: Diagnosis not present

## 2017-12-04 DIAGNOSIS — E039 Hypothyroidism, unspecified: Secondary | ICD-10-CM | POA: Diagnosis not present

## 2017-12-04 DIAGNOSIS — F419 Anxiety disorder, unspecified: Secondary | ICD-10-CM | POA: Diagnosis not present

## 2017-12-04 DIAGNOSIS — Z7901 Long term (current) use of anticoagulants: Secondary | ICD-10-CM | POA: Diagnosis not present

## 2017-12-04 DIAGNOSIS — E559 Vitamin D deficiency, unspecified: Secondary | ICD-10-CM | POA: Diagnosis not present

## 2017-12-04 DIAGNOSIS — I4891 Unspecified atrial fibrillation: Secondary | ICD-10-CM | POA: Diagnosis not present

## 2017-12-04 DIAGNOSIS — Q383 Other congenital malformations of tongue: Secondary | ICD-10-CM | POA: Diagnosis not present

## 2017-12-04 DIAGNOSIS — M8588 Other specified disorders of bone density and structure, other site: Secondary | ICD-10-CM | POA: Diagnosis not present

## 2017-12-21 DIAGNOSIS — E559 Vitamin D deficiency, unspecified: Secondary | ICD-10-CM | POA: Diagnosis not present

## 2017-12-21 DIAGNOSIS — E871 Hypo-osmolality and hyponatremia: Secondary | ICD-10-CM | POA: Diagnosis not present

## 2018-01-13 IMAGING — US ULTRASOUND LEFT BREAST LIMITED
1 series · 2 of 2 positions shown · non-contrast
Comparison: Previous exam(s).

CLINICAL DATA: 75-year-old female presenting for evaluation of the
left breast for a tender palpable lump. The patient had a normal
screening mammogram in Wednesday May, 2015.

EXAM:
2D DIGITAL DIAGNOSTIC BILATERAL MAMMOGRAM WITH CAD AND ADJUNCT TOMO
LEFT BREAST ULTRASOUND

[Series 1: ultrasound left breast limited · 0.07mm/px · 2 of 2 slices shown]
[im 1/2]
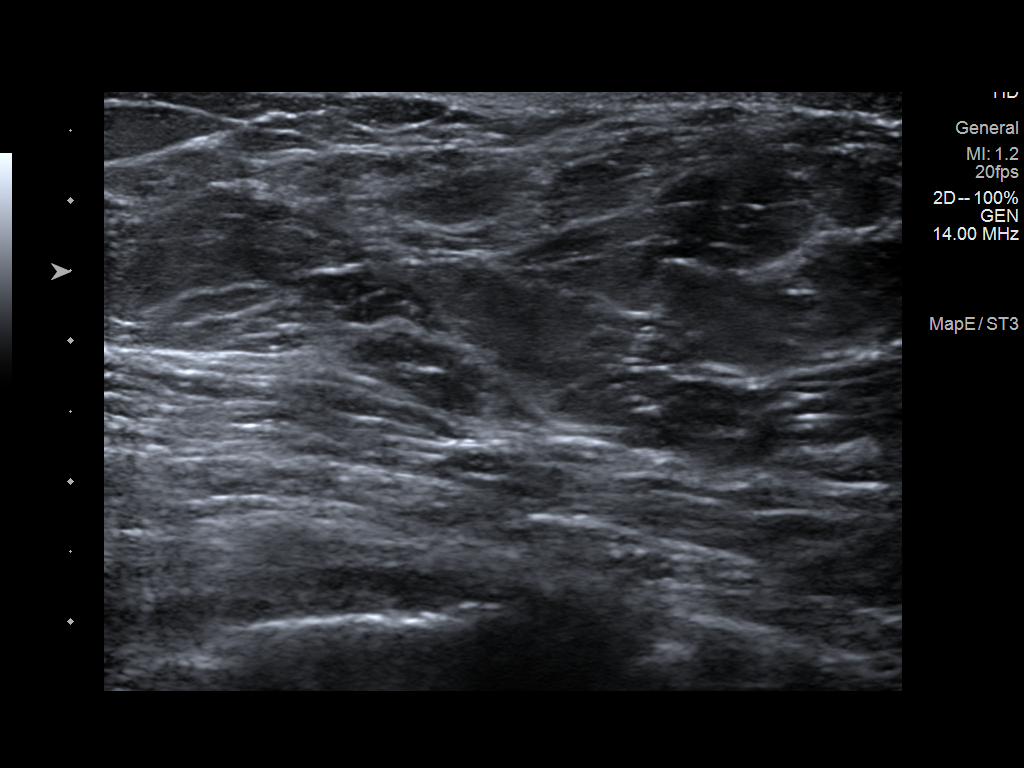
[im 2/2]
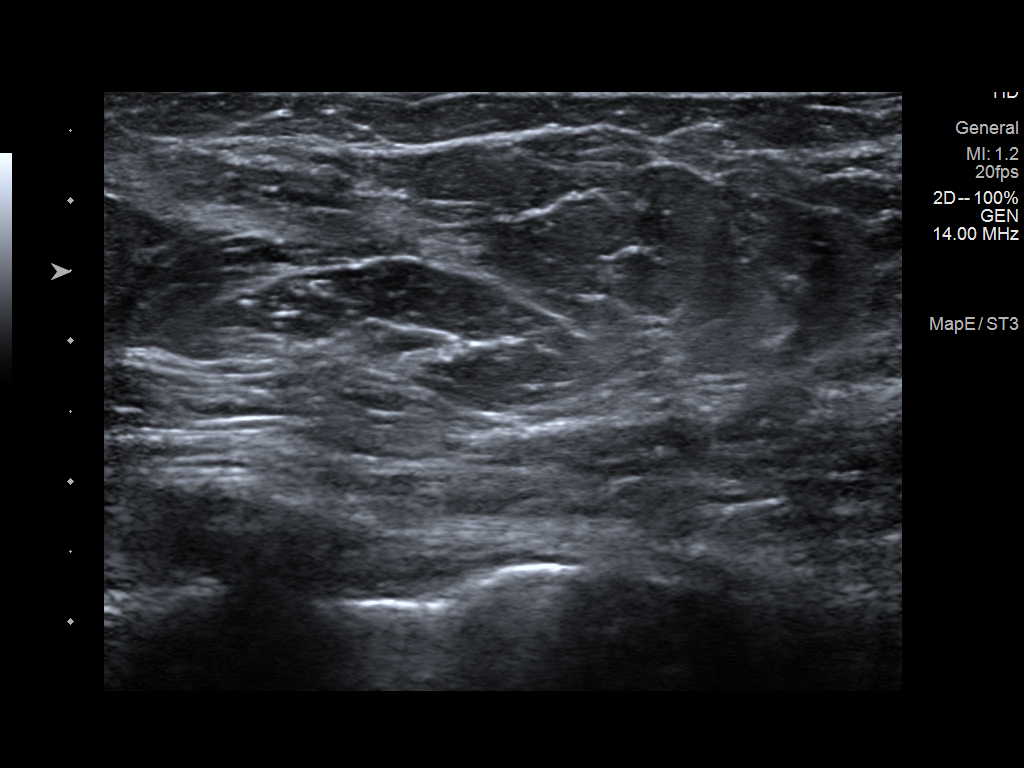

[2 of 2 positions shown; findings below may reference images not displayed]

ACR Breast Density Category b: There are scattered areas of
fibroglandular density.
FINDINGS: A BB has been placed on the medial aspect of the left breast
indicating the site of concern. There are no suspicious mammographic
findings identified deep to the skin marker. No suspicious
calcifications, masses or areas of distortion are seen in the
bilateral breasts.

Mammographic images were processed with CAD.

Physical exam of the palpable tender site in the medial left breast
demonstrates a firm ridge of tissue without discrete palpable mass.

Ultrasound targeted to the palpable area of concern demonstrates
normal fibroglandular tissue. No masses or suspicious areas of
shadowing are identified.
IMPRESSION: 1. There is no mammographic or targeted sonographic abnormality
identified at the palpable site of concern in the left breast.

2.  No mammographic evidence of malignancy in the bilateral breasts.

RECOMMENDATION:
1. Clinical follow-up recommended for the new palpable area of
concern in the left breast. Any further workup should be based on
clinical grounds.

2.  Screening mammogram in one year.(Code:AL-G-W2S)

I have discussed the findings and recommendations with the patient.
Results were also provided in writing at the conclusion of the
visit. If applicable, a reminder letter will be sent to the patient
regarding the next appointment.

BI-RADS CATEGORY  1: Negative.

## 2018-01-29 DIAGNOSIS — Z85828 Personal history of other malignant neoplasm of skin: Secondary | ICD-10-CM | POA: Diagnosis not present

## 2018-01-29 DIAGNOSIS — L817 Pigmented purpuric dermatosis: Secondary | ICD-10-CM | POA: Diagnosis not present

## 2018-01-29 DIAGNOSIS — D225 Melanocytic nevi of trunk: Secondary | ICD-10-CM | POA: Diagnosis not present

## 2018-01-29 DIAGNOSIS — L821 Other seborrheic keratosis: Secondary | ICD-10-CM | POA: Diagnosis not present

## 2018-01-29 DIAGNOSIS — L812 Freckles: Secondary | ICD-10-CM | POA: Diagnosis not present

## 2018-01-29 DIAGNOSIS — D1801 Hemangioma of skin and subcutaneous tissue: Secondary | ICD-10-CM | POA: Diagnosis not present

## 2018-01-29 DIAGNOSIS — L720 Epidermal cyst: Secondary | ICD-10-CM | POA: Diagnosis not present

## 2018-01-29 DIAGNOSIS — L570 Actinic keratosis: Secondary | ICD-10-CM | POA: Diagnosis not present

## 2018-02-18 ENCOUNTER — Encounter: Payer: Self-pay | Admitting: Podiatry

## 2018-02-18 ENCOUNTER — Ambulatory Visit: Payer: PPO | Admitting: Podiatry

## 2018-02-18 DIAGNOSIS — L600 Ingrowing nail: Secondary | ICD-10-CM

## 2018-02-18 DIAGNOSIS — Q828 Other specified congenital malformations of skin: Secondary | ICD-10-CM | POA: Diagnosis not present

## 2018-02-18 NOTE — Patient Instructions (Signed)

## 2018-02-19 NOTE — Progress Notes (Signed)
Subjective:   Patient ID: Janice David, female   DOB: 77 y.o.   MRN: [id number]   HPI Patient presents with painful lesions on the bottom of both feet and also has an ingrown toenail right hallux lateral border that is been painful and make it hard for her to wear shoe gear comfortably   ROS      Objective:  Physical Exam  Neurovascular status intact with incurvated lateral border right hallux with slight distal redness and localized drainage but no proximal edema erythema drainage noted with keratotic lesions plantar aspect both feet that are painful when palpated     Assessment:  Localized paronychia ingrown toenail deformity right hallux along with lesion formation bilateral     Plan:  For the nails I do think a ingrown toenail excision would be best and I infiltrated 60 mill grams like Marcaine mixture.  Patient decided against permanent correction so I did do a paronychia excision taking out the lateral corner taking out a small nonnecrotic tissue and creating a channel for drainage that may occur.  She will soak and sterile dressing was applied and I did sharp sterile debridement of lesions plantar aspect both feet with no iatrogenic bleeding and she will be seen back as needed and may ultimately require permanent procedure

## 2018-04-21 ENCOUNTER — Encounter (HOSPITAL_COMMUNITY): Payer: Self-pay | Admitting: Nurse Practitioner

## 2018-04-21 ENCOUNTER — Ambulatory Visit (HOSPITAL_COMMUNITY)
Admission: RE | Admit: 2018-04-21 | Discharge: 2018-04-21 | Disposition: A | Discharge status: Home / Self Care | Payer: PPO | Source: Ambulatory Visit | Attending: Nurse Practitioner | Admitting: Nurse Practitioner

## 2018-04-21 VITALS — BP 142/78 | HR 82 | Ht 62.5 in | Wt 215.8 lb

## 2018-04-21 DIAGNOSIS — Z9889 Other specified postprocedural states: Secondary | ICD-10-CM | POA: Diagnosis not present

## 2018-04-21 DIAGNOSIS — R9431 Abnormal electrocardiogram [ECG] [EKG]: Secondary | ICD-10-CM | POA: Insufficient documentation

## 2018-04-21 DIAGNOSIS — Z882 Allergy status to sulfonamides status: Secondary | ICD-10-CM | POA: Diagnosis not present

## 2018-04-21 DIAGNOSIS — Z7901 Long term (current) use of anticoagulants: Secondary | ICD-10-CM | POA: Diagnosis not present

## 2018-04-21 DIAGNOSIS — E559 Vitamin D deficiency, unspecified: Secondary | ICD-10-CM | POA: Insufficient documentation

## 2018-04-21 DIAGNOSIS — Z8249 Family history of ischemic heart disease and other diseases of the circulatory system: Secondary | ICD-10-CM | POA: Diagnosis not present

## 2018-04-21 DIAGNOSIS — I4821 Permanent atrial fibrillation: Secondary | ICD-10-CM | POA: Diagnosis not present

## 2018-04-21 DIAGNOSIS — R0789 Other chest pain: Secondary | ICD-10-CM

## 2018-04-21 DIAGNOSIS — Z79899 Other long term (current) drug therapy: Secondary | ICD-10-CM | POA: Insufficient documentation

## 2018-04-21 DIAGNOSIS — I482 Chronic atrial fibrillation, unspecified: Secondary | ICD-10-CM | POA: Diagnosis not present

## 2018-04-21 DIAGNOSIS — Z888 Allergy status to other drugs, medicaments and biological substances status: Secondary | ICD-10-CM | POA: Diagnosis not present

## 2018-04-21 DIAGNOSIS — E039 Hypothyroidism, unspecified: Secondary | ICD-10-CM | POA: Diagnosis not present

## 2018-04-21 NOTE — Progress Notes (Signed)
Primary Care Physician: Leighton Ruff, MD Referring Physician: Dr. Celine Mans Janice David is a 77 y.o. female with a h/o chronic afib that is in the afib clinic for f/u.Has been in chronic afib since 2014 and none of the options may benefit pt at this point. She has seen Dr. Rayann Heman in the past  and he said that she was not an ablation candidate.  He thought at that time pt may be best rate controlled. She is c/o intermittent chest pressure/left arm discomfort associated with belching that she is concerned about.  Today, she denies symptoms of palpitations, chest pain, orthopnea, PND, lower extremity edema, dizziness, presyncope, syncope, or neurologic sequela. Chronic shortness of breath. The patient is tolerating medications without difficulties and is otherwise without complaint today.   Past Medical History:  Diagnosis Date  . Anemia   . Asthma   . Atrial fibrillation ([place])   . Cataracta   . Diverticulosis   . GERD (gastroesophageal reflux disease)   . HLD (hyperlipidemia)   . Hypothyroidism   . Internal hemorrhoids   . Osteopenia   . Vaginal polyp   . Vitamin D deficiency    Past Surgical History:  Procedure Laterality Date  . ABDOMINAL HYSTERECTOMY  05/28/81   oavarian cyst and appendix removed  . birth mark surgery  2/81, 1976  . BREAST LUMPECTOMY Left   . CARDIOVERSION N/A 03/25/2013   Procedure: CARDIOVERSION;  Surgeon: Darlin Coco, MD;  Location: [facility];  Service: Cardiovascular;  Laterality: N/A;  . CATARACT EXTRACTION, BILATERAL    . CESAREAN SECTION  02/07/70  . DILATION AND CURETTAGE OF UTERUS  03/20/80  . KNEE ARTHROSCOPY Left 01/23/03  . LAPAROSCOPY  1979  . TONSILLECTOMY AND ADENOIDECTOMY    . TUBAL LIGATION      Current Outpatient Medications  Medication Sig Dispense Refill  . cholecalciferol (VITAMIN D) 1000 UNITS tablet Take 1,000 Units by mouth daily. Patient taking 4 caps (4000 units) daily by mouth.    . levothyroxine (SYNTHROID,  LEVOTHROID) 50 MCG tablet Take 50 mcg by mouth daily before breakfast.    . metoprolol tartrate (LOPRESSOR) 25 MG tablet TAKE 1 TABLET BY MOUTH TWICE DAILY 180 tablet 2  . rivaroxaban (XARELTO) 20 MG TABS tablet Take 1 tablet (20 mg total) by mouth daily with supper. 90 tablet 3  . verapamil (CALAN-SR) 120 MG CR tablet TAKE 1 TABLET BY MOUTH AT BEDTIME 90 tablet 2   No current facility-administered medications for this encounter.     Allergies  Allergen Reactions  . Diltiazem Hcl     IV was OK, oral med caused nausea and possibly diarrhea.  . Sulfonamide Derivatives     Unknown reaction, possible hives/sores    Social History   Socioeconomic History  . Marital status: Divorced    Spouse name: Not on file  . Number of children: 2  . Years of education: Not on file  . Highest education level: Not on file  Occupational History  . Occupation: retired  Scientific laboratory technician  . Financial resource strain: Not on file  . Food insecurity:    Worry: Not on file    Inability: Not on file  . Transportation needs:    Medical: Not on file    Non-medical: Not on file  Tobacco Use  . Smoking status: Never Smoker  . Smokeless tobacco: Never Used  Substance and Sexual Activity  . Alcohol use: Yes    Comment: occassionally  . Drug use: No  .  Sexual activity: Not on file  Lifestyle  . Physical activity:    Days per week: Not on file    Minutes per session: Not on file  . Stress: Not on file  Relationships  . Social connections:    Talks on phone: Not on file    Gets together: Not on file    Attends religious service: Not on file    Active member of club or organization: Not on file    Attends meetings of clubs or organizations: Not on file    Relationship status: Not on file  . Intimate partner violence:    Fear of current or ex partner: Not on file    Emotionally abused: Not on file    Physically abused: Not on file    Forced sexual activity: Not on file  Other Topics Concern  . Not  on file  Social History Narrative  . Not on file    Family History  Problem Relation Age of Onset  . Skin cancer Father   . Heart disease Mother   . Colon cancer Neg Hx     ROS- All systems are reviewed and negative except as per the HPI above  Physical Exam: Vitals:   04/21/18 1102  BP: (!) 142/78  Pulse: 82  Weight: 97.9 kg  Height: 5' 2.5" (1.588 m)   Wt Readings from Last 3 Encounters:  04/21/18 97.9 kg  04/24/17 96.4 kg  04/16/16 96.2 kg    Labs: Lab Results  Component Value Date   NA 132 (L) 04/24/2017   K 5.1 04/24/2017   CL 95 (L) 04/24/2017   CO2 25 04/24/2017   GLUCOSE 88 04/24/2017   BUN 13 04/24/2017   CREATININE 0.75 04/24/2017   CALCIUM 10.6 (H) 04/24/2017   MG 2.3 02/10/2013   Lab Results  Component Value Date   INR 1.10 02/09/2013   Lab Results  Component Value Date   CHOL 177 02/10/2013   HDL 65 02/10/2013   LDLCALC 93 02/10/2013   TRIG 95 02/10/2013     GEN- The patient is well appearing, alert and oriented x 3 today.   Head- normocephalic, atraumatic Eyes-  Sclera clear, conjunctiva pink Ears- hearing intact Oropharynx- clear Neck- supple, no JVP Lymph- no cervical lymphadenopathy Lungs- Clear to ausculation bilaterally, normal work of breathing Heart- irregular rate and rhythm, no murmurs, rubs or gallops, PMI not laterally displaced GI- soft, NT, ND, + BS Extremities- no clubbing, cyanosis, or edema MS- no significant deformity or atrophy Skin- no rash or lesion Psych- euthymic mood, full affect Neuro- strength and sensation are intact  EKG-not done Echo-Study Conclusions  - Left ventricle: The cavity size was normal. Wall thickness was   normal. Systolic function was normal. The estimated ejection   fraction was in the range of 50% to 55%. Wall motion was normal;   there were no regional wall motion abnormalities. - Mitral valve: Calcified annulus. - Left atrium: The atrium was mildly dilated. - Right atrium: The  atrium was mildly dilated.  Impressions:  - Normal LV function; mild biatrial enlargement; trace MR and TR.   Assessment and Plan: 1.Long standing permanent  afib Well rate controlled  Rate control is her only option going forward as she has been in afib x 5-6 years Continue rate control with CCB, BB.  Continue xarelto with chadsvasc score of at least 2.  2. Chest pressure  Pt is concerned about intermittent chest/ L arm  pressure that appears to be  more at rest, than with exertion Will schedule for a lexi [employer] C. Elsye Mccollister, [facility] [address] [place], [place] [address] [phone]

## 2018-04-22 ENCOUNTER — Encounter: Payer: Self-pay | Admitting: Nurse Practitioner

## 2018-05-07 ENCOUNTER — Telehealth: Payer: Self-pay

## 2018-05-07 NOTE — Telephone Encounter (Signed)
New message    Just an FYI.  We have made several attempts to contact this patient including sending a letter to schedule or reschedule their MYOCARDIAL PERFUSION. We will be removing the patient from the WQ.   Thank you 

## 2018-05-13 DIAGNOSIS — J0100 Acute maxillary sinusitis, unspecified: Secondary | ICD-10-CM | POA: Diagnosis not present

## 2018-05-13 DIAGNOSIS — R05 Cough: Secondary | ICD-10-CM | POA: Diagnosis not present

## 2018-05-24 DIAGNOSIS — N842 Polyp of vagina: Secondary | ICD-10-CM | POA: Diagnosis not present

## 2018-05-24 DIAGNOSIS — Z1211 Encounter for screening for malignant neoplasm of colon: Secondary | ICD-10-CM | POA: Diagnosis not present

## 2018-05-24 DIAGNOSIS — E559 Vitamin D deficiency, unspecified: Secondary | ICD-10-CM | POA: Diagnosis not present

## 2018-05-24 DIAGNOSIS — M8588 Other specified disorders of bone density and structure, other site: Secondary | ICD-10-CM | POA: Diagnosis not present

## 2018-05-24 DIAGNOSIS — K219 Gastro-esophageal reflux disease without esophagitis: Secondary | ICD-10-CM | POA: Diagnosis not present

## 2018-05-24 DIAGNOSIS — E039 Hypothyroidism, unspecified: Secondary | ICD-10-CM | POA: Diagnosis not present

## 2018-05-24 DIAGNOSIS — E7800 Pure hypercholesterolemia, unspecified: Secondary | ICD-10-CM | POA: Diagnosis not present

## 2018-05-24 DIAGNOSIS — Z0000 Encounter for general adult medical examination without abnormal findings: Secondary | ICD-10-CM | POA: Diagnosis not present

## 2018-05-24 DIAGNOSIS — F419 Anxiety disorder, unspecified: Secondary | ICD-10-CM | POA: Diagnosis not present

## 2018-05-24 DIAGNOSIS — Z7901 Long term (current) use of anticoagulants: Secondary | ICD-10-CM | POA: Diagnosis not present

## 2018-05-24 DIAGNOSIS — I4891 Unspecified atrial fibrillation: Secondary | ICD-10-CM | POA: Diagnosis not present

## 2018-05-27 DIAGNOSIS — Z961 Presence of intraocular lens: Secondary | ICD-10-CM | POA: Diagnosis not present

## 2018-05-27 DIAGNOSIS — H18413 Arcus senilis, bilateral: Secondary | ICD-10-CM | POA: Diagnosis not present

## 2018-05-27 DIAGNOSIS — H04123 Dry eye syndrome of bilateral lacrimal glands: Secondary | ICD-10-CM | POA: Diagnosis not present

## 2018-05-27 DIAGNOSIS — H35373 Puckering of macula, bilateral: Secondary | ICD-10-CM | POA: Diagnosis not present

## 2018-06-01 ENCOUNTER — Other Ambulatory Visit: Payer: Self-pay | Admitting: Family Medicine

## 2018-06-01 DIAGNOSIS — Z1231 Encounter for screening mammogram for malignant neoplasm of breast: Secondary | ICD-10-CM

## 2018-06-22 ENCOUNTER — Other Ambulatory Visit (HOSPITAL_COMMUNITY): Payer: Self-pay | Admitting: *Deleted

## 2018-06-22 MED ORDER — RIVAROXABAN 20 MG PO TABS: 20.0000 mg | ORAL_TABLET | Freq: Every day | ORAL | 3 refills | Status: DC

## 2018-06-29 ENCOUNTER — Ambulatory Visit
Admission: RE | Admit: 2018-06-29 | Discharge: 2018-06-29 | Disposition: A | Discharge status: Home / Self Care | Payer: PPO | Source: Ambulatory Visit | Attending: Family Medicine | Admitting: Family Medicine

## 2018-06-29 DIAGNOSIS — Z1231 Encounter for screening mammogram for malignant neoplasm of breast: Secondary | ICD-10-CM | POA: Diagnosis not present

## 2018-07-05 ENCOUNTER — Other Ambulatory Visit (HOSPITAL_COMMUNITY): Payer: Self-pay | Admitting: *Deleted

## 2018-07-05 ENCOUNTER — Other Ambulatory Visit (HOSPITAL_COMMUNITY): Payer: Self-pay | Admitting: Nurse Practitioner

## 2018-07-05 MED ORDER — RIVAROXABAN 20 MG PO TABS: 20.0000 mg | ORAL_TABLET | Freq: Every day | ORAL | 3 refills | Status: DC

## 2019-02-18 DIAGNOSIS — E559 Vitamin D deficiency, unspecified: Secondary | ICD-10-CM | POA: Diagnosis not present

## 2019-02-18 DIAGNOSIS — E039 Hypothyroidism, unspecified: Secondary | ICD-10-CM | POA: Diagnosis not present

## 2019-02-18 DIAGNOSIS — E7800 Pure hypercholesterolemia, unspecified: Secondary | ICD-10-CM | POA: Diagnosis not present

## 2019-02-21 DIAGNOSIS — L812 Freckles: Secondary | ICD-10-CM | POA: Diagnosis not present

## 2019-02-21 DIAGNOSIS — Z85828 Personal history of other malignant neoplasm of skin: Secondary | ICD-10-CM | POA: Diagnosis not present

## 2019-02-21 DIAGNOSIS — D225 Melanocytic nevi of trunk: Secondary | ICD-10-CM | POA: Diagnosis not present

## 2019-02-21 DIAGNOSIS — L570 Actinic keratosis: Secondary | ICD-10-CM | POA: Diagnosis not present

## 2019-02-21 DIAGNOSIS — L821 Other seborrheic keratosis: Secondary | ICD-10-CM | POA: Diagnosis not present

## 2019-02-21 DIAGNOSIS — D1801 Hemangioma of skin and subcutaneous tissue: Secondary | ICD-10-CM | POA: Diagnosis not present

## 2019-02-24 DIAGNOSIS — R351 Nocturia: Secondary | ICD-10-CM | POA: Diagnosis not present

## 2019-02-24 DIAGNOSIS — E559 Vitamin D deficiency, unspecified: Secondary | ICD-10-CM | POA: Diagnosis not present

## 2019-02-24 DIAGNOSIS — E039 Hypothyroidism, unspecified: Secondary | ICD-10-CM | POA: Diagnosis not present

## 2019-02-24 DIAGNOSIS — Z7901 Long term (current) use of anticoagulants: Secondary | ICD-10-CM | POA: Diagnosis not present

## 2019-02-24 DIAGNOSIS — E871 Hypo-osmolality and hyponatremia: Secondary | ICD-10-CM | POA: Diagnosis not present

## 2019-02-24 DIAGNOSIS — I4891 Unspecified atrial fibrillation: Secondary | ICD-10-CM | POA: Diagnosis not present

## 2019-02-24 DIAGNOSIS — E7800 Pure hypercholesterolemia, unspecified: Secondary | ICD-10-CM | POA: Diagnosis not present

## 2019-04-10 ENCOUNTER — Other Ambulatory Visit (HOSPITAL_COMMUNITY): Payer: Self-pay | Admitting: Nurse Practitioner

## 2019-06-02 DIAGNOSIS — I4891 Unspecified atrial fibrillation: Secondary | ICD-10-CM | POA: Diagnosis not present

## 2019-06-02 DIAGNOSIS — E7800 Pure hypercholesterolemia, unspecified: Secondary | ICD-10-CM | POA: Diagnosis not present

## 2019-06-02 DIAGNOSIS — H269 Unspecified cataract: Secondary | ICD-10-CM | POA: Diagnosis not present

## 2019-06-02 DIAGNOSIS — E039 Hypothyroidism, unspecified: Secondary | ICD-10-CM | POA: Diagnosis not present

## 2019-06-09 DIAGNOSIS — I4891 Unspecified atrial fibrillation: Secondary | ICD-10-CM | POA: Diagnosis not present

## 2019-06-09 DIAGNOSIS — E039 Hypothyroidism, unspecified: Secondary | ICD-10-CM | POA: Diagnosis not present

## 2019-06-09 DIAGNOSIS — E7800 Pure hypercholesterolemia, unspecified: Secondary | ICD-10-CM | POA: Diagnosis not present

## 2019-06-09 DIAGNOSIS — H269 Unspecified cataract: Secondary | ICD-10-CM | POA: Diagnosis not present

## 2019-07-12 ENCOUNTER — Other Ambulatory Visit (HOSPITAL_COMMUNITY): Payer: Self-pay | Admitting: Nurse Practitioner

## 2019-07-29 DIAGNOSIS — E7800 Pure hypercholesterolemia, unspecified: Secondary | ICD-10-CM | POA: Diagnosis not present

## 2019-07-29 DIAGNOSIS — H269 Unspecified cataract: Secondary | ICD-10-CM | POA: Diagnosis not present

## 2019-07-29 DIAGNOSIS — I4891 Unspecified atrial fibrillation: Secondary | ICD-10-CM | POA: Diagnosis not present

## 2019-07-29 DIAGNOSIS — E039 Hypothyroidism, unspecified: Secondary | ICD-10-CM | POA: Diagnosis not present

## 2019-08-11 ENCOUNTER — Other Ambulatory Visit: Payer: Self-pay

## 2019-08-11 ENCOUNTER — Encounter (HOSPITAL_COMMUNITY): Payer: Self-pay | Admitting: Nurse Practitioner

## 2019-08-11 ENCOUNTER — Ambulatory Visit (HOSPITAL_COMMUNITY)
Admission: RE | Admit: 2019-08-11 | Discharge: 2019-08-11 | Disposition: A | Discharge status: Home / Self Care | Payer: PPO | Source: Ambulatory Visit | Attending: Nurse Practitioner | Admitting: Nurse Practitioner

## 2019-08-11 VITALS — BP 140/88 | HR 95 | Ht 62.5 in | Wt 217.0 lb

## 2019-08-11 DIAGNOSIS — E559 Vitamin D deficiency, unspecified: Secondary | ICD-10-CM | POA: Insufficient documentation

## 2019-08-11 DIAGNOSIS — I10 Essential (primary) hypertension: Secondary | ICD-10-CM | POA: Diagnosis not present

## 2019-08-11 DIAGNOSIS — Z79890 Hormone replacement therapy (postmenopausal): Secondary | ICD-10-CM | POA: Diagnosis not present

## 2019-08-11 DIAGNOSIS — D6869 Other thrombophilia: Secondary | ICD-10-CM | POA: Diagnosis not present

## 2019-08-11 DIAGNOSIS — Z888 Allergy status to other drugs, medicaments and biological substances status: Secondary | ICD-10-CM | POA: Insufficient documentation

## 2019-08-11 DIAGNOSIS — I4821 Permanent atrial fibrillation: Secondary | ICD-10-CM | POA: Diagnosis not present

## 2019-08-11 DIAGNOSIS — Z808 Family history of malignant neoplasm of other organs or systems: Secondary | ICD-10-CM | POA: Diagnosis not present

## 2019-08-11 DIAGNOSIS — Z8249 Family history of ischemic heart disease and other diseases of the circulatory system: Secondary | ICD-10-CM | POA: Diagnosis not present

## 2019-08-11 DIAGNOSIS — Z7901 Long term (current) use of anticoagulants: Secondary | ICD-10-CM | POA: Diagnosis not present

## 2019-08-11 DIAGNOSIS — I482 Chronic atrial fibrillation, unspecified: Secondary | ICD-10-CM | POA: Diagnosis not present

## 2019-08-11 DIAGNOSIS — Z79899 Other long term (current) drug therapy: Secondary | ICD-10-CM | POA: Diagnosis not present

## 2019-08-11 MED ORDER — VERAPAMIL HCL ER 120 MG PO TBCR: 120.0000 mg | EXTENDED_RELEASE_TABLET | Freq: Every day | ORAL | 2 refills | Status: DC

## 2019-08-11 MED ORDER — METOPROLOL TARTRATE 25 MG PO TABS: 25.0000 mg | ORAL_TABLET | Freq: Two times a day (BID) | ORAL | 2 refills | Status: DC

## 2019-08-11 NOTE — Progress Notes (Signed)
Primary Care Physician: Leighton Ruff, MD Referring Physician: Dr. Denver Faster Janice David is a 79 y.o. female with a h/o chronic afib that is in the afib clinic for f/u.Has been in chronic afib since 2014 and none of the options may benefit pt at this point. She has seen Dr. Rayann Heman in the past  and he said that she was not an ablation candidate.  He thought at that time pt may be best rate controlled. She is c/o intermittent chest pressure/left arm discomfort associated with belching that she is concerned about.  F/u in afib clinic, 08/11/19, she remains in rate controlled afib. Noticed some mild ankle edema over the last week. Admits to some mild ankle edema but has dangled a lot of this week and has increased salt intake. Labs from October with PCP showed creatinine  at 0.76 and  BUN at 11.  Today, she denies symptoms of palpitations, chest pain, orthopnea, PND, lower extremity edema, dizziness, presyncope, syncope, or neurologic sequela. Chronic shortness of breath. The patient is tolerating medications without difficulties and is otherwise without complaint today.   Past Medical History:  Diagnosis Date  . Anemia   . Asthma   . Atrial fibrillation ([place])   . Cataracta   . Diverticulosis   . GERD (gastroesophageal reflux disease)   . HLD (hyperlipidemia)   . Hypothyroidism   . Internal hemorrhoids   . Osteopenia   . Vaginal polyp   . Vitamin D deficiency    Past Surgical History:  Procedure Laterality Date  . ABDOMINAL HYSTERECTOMY  05/28/81   oavarian cyst and appendix removed  . birth mark surgery  2/81, 1976  . BREAST EXCISIONAL BIOPSY Left    benign  . CARDIOVERSION N/A 03/25/2013   Procedure: CARDIOVERSION;  Surgeon: Darlin Coco, MD;  Location: [facility];  Service: Cardiovascular;  Laterality: N/A;  . CATARACT EXTRACTION, BILATERAL    . CESAREAN SECTION  02/07/70  . DILATION AND CURETTAGE OF UTERUS  03/20/80  . KNEE ARTHROSCOPY Left 01/23/03  .  LAPAROSCOPY  1979  . TONSILLECTOMY AND ADENOIDECTOMY    . TUBAL LIGATION      Current Outpatient Medications  Medication Sig Dispense Refill  . cholecalciferol (VITAMIN D) 1000 UNITS tablet Take 1,000 Units by mouth daily. Patient taking 4 caps (4000 units) daily by mouth.    . levothyroxine (SYNTHROID, LEVOTHROID) 50 MCG tablet Take 50 mcg by mouth daily before breakfast.    . metoprolol tartrate (LOPRESSOR) 25 MG tablet TAKE 1 TABLET BY MOUTH TWICE DAILY 180 tablet 2  . rivaroxaban (XARELTO) 20 MG TABS tablet Take 1 tablet (20 mg total) by mouth daily with supper. 90 tablet 3  . verapamil (CALAN-SR) 120 MG CR tablet Take 1 tablet (120 mg total) by mouth at bedtime. Appointment Required For Further Refills [phone] 30 tablet 0   No current facility-administered medications for this encounter.    Allergies  Allergen Reactions  . Diltiazem Hcl     IV was OK, oral med caused nausea and possibly diarrhea.  . Sulfonamide Derivatives     Unknown reaction, possible hives/sores    Social History   Socioeconomic History  . Marital status: Divorced    Spouse name: Not on file  . Number of children: 2  . Years of education: Not on file  . Highest education level: Not on file  Occupational History  . Occupation: retired  Tobacco Use  . Smoking status: Never Smoker  . Smokeless tobacco: Never Used  Substance and Sexual Activity  . Alcohol use: Yes    Comment: occassionally  . Drug use: No  . Sexual activity: Not on file  Other Topics Concern  . Not on file  Social History Narrative  . Not on file   Social Determinants of Health   Financial Resource Strain:   . Difficulty of Paying Living Expenses:   Food Insecurity:   . Worried About Charity fundraiser in the Last Year:   . Arboriculturist in the Last Year:   Transportation Needs:   . Film/video editor (Medical):   Marland Kitchen Lack of Transportation (Non-Medical):   Physical Activity:   . Days of Exercise per Week:   .  Minutes of Exercise per Session:   Stress:   . Feeling of Stress :   Social Connections:   . Frequency of Communication with Friends and Family:   . Frequency of Social Gatherings with Friends and Family:   . Attends Religious Services:   . Active Member of Clubs or Organizations:   . Attends Archivist Meetings:   Marland Kitchen Marital Status:   Intimate Partner Violence:   . Fear of Current or Ex-Partner:   . Emotionally Abused:   Marland Kitchen Physically Abused:   . Sexually Abused:     Family History  Problem Relation Age of Onset  . Skin cancer Father   . Heart disease Mother   . Colon cancer Neg Hx     ROS- All systems are reviewed and negative except as per the HPI above  Physical Exam: There were no vitals filed for this visit. Wt Readings from Last 3 Encounters:  04/21/18 215 lb 12.8 oz (97.9 kg)  04/24/17 212 lb 9.6 oz (96.4 kg)  04/16/16 212 lb (96.2 kg)    Labs: Lab Results  Component Value Date   NA 132 (L) 04/24/2017   K 5.1 04/24/2017   CL 95 (L) 04/24/2017   CO2 25 04/24/2017   GLUCOSE 88 04/24/2017   BUN 13 04/24/2017   CREATININE 0.75 04/24/2017   CALCIUM 10.6 (H) 04/24/2017   MG 2.3 02/10/2013   Lab Results  Component Value Date   INR 1.10 02/09/2013   Lab Results  Component Value Date   CHOL 177 02/10/2013   HDL 65 02/10/2013   LDLCALC 93 02/10/2013   TRIG 95 02/10/2013     GEN- The patient is well appearing, alert and oriented x 3 today.   Head- normocephalic, atraumatic Eyes-  Sclera clear, conjunctiva pink Ears- hearing intact Oropharynx- clear Neck- supple, no JVP Lymph- no cervical lymphadenopathy Lungs- Clear to ausculation bilaterally, normal work of breathing Heart- irregular rate and rhythm, no murmurs, rubs or gallops, PMI not laterally displaced GI- soft, NT, ND, + BS Extremities- no clubbing, cyanosis, or edema MS- no significant deformity or atrophy Skin- no rash or lesion Psych- euthymic mood, full affect Neuro- strength  and sensation are intact  EKG- afib at 95, qrs int 80 ms, 444 ms qtc Echo-Study Conclusions  - Left ventricle: The cavity size was normal. Wall thickness was   normal. Systolic function was normal. The estimated ejection   fraction was in the range of 50% to 55%. Wall motion was normal;   there were no regional wall motion abnormalities. - Mitral valve: Calcified annulus. - Left atrium: The atrium was mildly dilated. - Right atrium: The atrium was mildly dilated.  Impressions:  - Normal LV function; mild biatrial enlargement; trace MR and TR.  Assessment and Plan: 1.Long standing permanent  afib Well rate controlled  Rate control is her only option going forward as she has been in afib x 6-7 years Continue rate control with CCB, BB.  Continue xarelto with chadsvasc score of at least 2, labs reviewed from PCP and will continue at current dose.  2. HTN Stable   F/u in 6 months    Butch Penny C. Alontae Chaloux, [facility] [address] [place], [place] [address] [phone]

## 2019-08-30 DIAGNOSIS — Z1211 Encounter for screening for malignant neoplasm of colon: Secondary | ICD-10-CM | POA: Diagnosis not present

## 2019-08-30 DIAGNOSIS — E559 Vitamin D deficiency, unspecified: Secondary | ICD-10-CM | POA: Diagnosis not present

## 2019-08-30 DIAGNOSIS — Z0000 Encounter for general adult medical examination without abnormal findings: Secondary | ICD-10-CM | POA: Diagnosis not present

## 2019-08-30 DIAGNOSIS — M8588 Other specified disorders of bone density and structure, other site: Secondary | ICD-10-CM | POA: Diagnosis not present

## 2019-08-30 DIAGNOSIS — E7800 Pure hypercholesterolemia, unspecified: Secondary | ICD-10-CM | POA: Diagnosis not present

## 2019-08-30 DIAGNOSIS — I4891 Unspecified atrial fibrillation: Secondary | ICD-10-CM | POA: Diagnosis not present

## 2019-08-30 DIAGNOSIS — F419 Anxiety disorder, unspecified: Secondary | ICD-10-CM | POA: Diagnosis not present

## 2019-08-30 DIAGNOSIS — E871 Hypo-osmolality and hyponatremia: Secondary | ICD-10-CM | POA: Diagnosis not present

## 2019-08-30 DIAGNOSIS — E039 Hypothyroidism, unspecified: Secondary | ICD-10-CM | POA: Diagnosis not present

## 2019-08-30 DIAGNOSIS — K219 Gastro-esophageal reflux disease without esophagitis: Secondary | ICD-10-CM | POA: Diagnosis not present

## 2019-08-30 DIAGNOSIS — Z7901 Long term (current) use of anticoagulants: Secondary | ICD-10-CM | POA: Diagnosis not present

## 2019-09-13 DIAGNOSIS — Z1211 Encounter for screening for malignant neoplasm of colon: Secondary | ICD-10-CM | POA: Diagnosis not present

## 2019-09-22 DIAGNOSIS — R195 Other fecal abnormalities: Secondary | ICD-10-CM | POA: Diagnosis not present

## 2019-09-28 ENCOUNTER — Other Ambulatory Visit (HOSPITAL_COMMUNITY): Payer: Self-pay | Admitting: Nurse Practitioner

## 2019-10-25 ENCOUNTER — Ambulatory Visit: Payer: PPO | Admitting: Physician Assistant

## 2019-10-25 ENCOUNTER — Encounter: Payer: Self-pay | Admitting: Physician Assistant

## 2019-10-25 ENCOUNTER — Telehealth: Payer: Self-pay

## 2019-10-25 VITALS — BP 128/92 | HR 83 | Ht 62.0 in | Wt 216.0 lb

## 2019-10-25 DIAGNOSIS — Z7901 Long term (current) use of anticoagulants: Secondary | ICD-10-CM | POA: Diagnosis not present

## 2019-10-25 DIAGNOSIS — R195 Other fecal abnormalities: Secondary | ICD-10-CM

## 2019-10-25 DIAGNOSIS — I4891 Unspecified atrial fibrillation: Secondary | ICD-10-CM | POA: Diagnosis not present

## 2019-10-25 MED ORDER — NA SULFATE-K SULFATE-MG SULF 17.5-3.13-1.6 GM/177ML PO SOLN: 1.0000 | Freq: Once | ORAL | 0 refills | Status: AC

## 2019-10-25 NOTE — Telephone Encounter (Signed)
[facility] Pre-operative Risk Assessment     Request for surgical clearance:     Endoscopy Procedure  What type of surgery is being performed?     Colonoscopy  When is this surgery scheduled?     11/23/19  What type of clearance is required ?   Pharmacy  Are there any medications that need to be held prior to surgery and how long? Xarelto starting 2 days prior  Practice name and name of physician performing surgery?      [facility]  What is your office phone and fax number?      Phone- [phone]  [phone]  Anesthesia type (None, local, MAC, general) ?       MAC

## 2019-10-25 NOTE — Patient Instructions (Signed)
If you are age 78 or older, your body mass index should be between 23-30. Your Body mass index is 39.51 kg/m. If this is out of the aforementioned range listed, please consider follow up with your Primary Care Provider.  If you are age 23 or younger, your body mass index should be between 19-25. Your Body mass index is 39.51 kg/m. If this is out of the aformentioned range listed, please consider follow up with your Primary Care Provider.   You have been scheduled for a colonoscopy. Please follow written instructions given to you at your visit today.  Please pick up your prep supplies at the pharmacy within the next 1-3 days. If you use inhalers (even only as needed), please bring them with you on the day of your procedure.  You will be contacted by our office prior to your procedure for directions on holding your Xarelto.  If you do not hear from our office 1 week prior to your scheduled procedure, please call [phone] to discuss.   Follow up pending the results of your Colonoscopy.

## 2019-10-25 NOTE — Progress Notes (Signed)
Chief Complaint: Positive Hemoccult test  HPI:    Janice David is a 79 year old female with a past medical history as listed below including A. fib on Xarelto, known to Dr. Silverio Decamp, who was referred to me by Leighton Ruff, MD for a complaint of positive Hemoccult.      03/16/2008 colonoscopy with Dr. Olevia Perches diverticulosis and hemorrhoids.  Repeat recommended in 10 years.    10/02/2015 office visit with Dr. Silverio Decamp    08/30/2019 CBC, CMP normal.  09/22/2019 + Hemoccult study x2.  09/26/2019 CBC normal.    Today, the patient presents to clinic and explains that she recently had positive blood in her stool on screening by her PCP.  Tells me that in general her GI issues are a lot better since seeing Dr. Silverio Decamp back in 2017, apparently she is taking Sheryle Spray which has solved almost all of her upper GI issues, she only uses Tums very occasionally.  The patient is most nervous about having to stop her anticoagulation for her A. fib.  Tells me she has never not taken this medicine "1 day" since she started it.  Apparently she already discussed this with her cardiologist who felt like it would be okay for her to hold.    Denies fever, chills, weight loss, change in bowel habits, abdominal pain or symptoms that awaken her from sleep.  Past Medical History:  Diagnosis Date  . Anemia   . Asthma   . Atrial fibrillation ([place])   . Cataracta   . Diverticulosis   . GERD (gastroesophageal reflux disease)   . HLD (hyperlipidemia)   . Hypothyroidism   . Internal hemorrhoids   . Osteopenia   . Vaginal polyp   . Vitamin D deficiency     Past Surgical History:  Procedure Laterality Date  . ABDOMINAL HYSTERECTOMY  05/28/81   oavarian cyst and appendix removed  . birth mark surgery  2/81, 1976  . BREAST EXCISIONAL BIOPSY Left    benign  . CARDIOVERSION N/A 03/25/2013   Procedure: CARDIOVERSION;  Surgeon: Darlin Coco, MD;  Location: [facility];  Service: Cardiovascular;  Laterality: N/A;  .  CATARACT EXTRACTION, BILATERAL    . CESAREAN SECTION  02/07/70  . DILATION AND CURETTAGE OF UTERUS  03/20/80  . KNEE ARTHROSCOPY Left 01/23/03  . LAPAROSCOPY  1979  . TONSILLECTOMY AND ADENOIDECTOMY    . TUBAL LIGATION      Current Outpatient Medications  Medication Sig Dispense Refill  . cholecalciferol (VITAMIN D) 1000 UNITS tablet Patient taking 4 caps (4000 units) daily by mouth.    . levothyroxine (SYNTHROID, LEVOTHROID) 50 MCG tablet Take 50 mcg by mouth daily before breakfast.    . metoprolol tartrate (LOPRESSOR) 25 MG tablet Take 1 tablet (25 mg total) by mouth 2 (two) times daily. 180 tablet 2  . verapamil (CALAN-SR) 120 MG CR tablet Take 1 tablet (120 mg total) by mouth at bedtime. 90 tablet 2  . XARELTO 20 MG TABS tablet Take 1 tablet by mouth daily with supper 90 tablet 2   No current facility-administered medications for this visit.    Allergies as of 10/25/2019 - Review Complete 08/11/2019  Allergen Reaction Noted  . Diltiazem hcl  02/10/2013  . Sulfonamide derivatives      Family History  Problem Relation Age of Onset  . Skin cancer Father   . Heart disease Mother   . Colon cancer Neg Hx     Social History   Socioeconomic History  . Marital status: Divorced  Spouse name: Not on file  . Number of children: 2  . Years of education: Not on file  . Highest education level: Not on file  Occupational History  . Occupation: retired  Tobacco Use  . Smoking status: Never Smoker  . Smokeless tobacco: Never Used  Substance and Sexual Activity  . Alcohol use: Yes    Comment: occassionally  . Drug use: No  . Sexual activity: Not on file  Other Topics Concern  . Not on file  Social History Narrative  . Not on file   Social Determinants of Health   Financial Resource Strain:   . Difficulty of Paying Living Expenses:   Food Insecurity:   . Worried About Charity fundraiser in the Last Year:   . Arboriculturist in the Last Year:   Transportation Needs:     . Film/video editor (Medical):   Marland Kitchen Lack of Transportation (Non-Medical):   Physical Activity:   . Days of Exercise per Week:   . Minutes of Exercise per Session:   Stress:   . Feeling of Stress :   Social Connections:   . Frequency of Communication with Friends and Family:   . Frequency of Social Gatherings with Friends and Family:   . Attends Religious Services:   . Active Member of Clubs or Organizations:   . Attends Archivist Meetings:   Marland Kitchen Marital Status:   Intimate Partner Violence:   . Fear of Current or Ex-Partner:   . Emotionally Abused:   Marland Kitchen Physically Abused:   . Sexually Abused:     Review of Systems:    Constitutional: No weight loss, fever or chills Skin: No rash  Cardiovascular: No chest pain Respiratory: No SOB Gastrointestinal: See HPI and otherwise negative Genitourinary: No dysuria  Neurological: No headache Musculoskeletal: No new muscle or joint pain Hematologic: No bleeding Psychiatric: No history of depression or anxiety   Physical Exam:  Vital signs: BP (!) 128/92   Pulse 83   Ht 5\' 2"  (1.575 m)   Wt 216 lb (98 kg)   SpO2 97%   BMI 39.51 kg/m   Constitutional:   Pleasant Elderly Caucasian female appears to be in NAD, Well developed, Well nourished, alert and cooperative Head:  Normocephalic and atraumatic. Eyes:   PEERL, EOMI. No icterus. Conjunctiva pink. Ears:  Normal auditory acuity. Neck:  Supple Throat: Oral cavity and pharynx without inflammation, swelling or lesion.  Respiratory: Respirations even and unlabored. Lungs clear to auscultation bilaterally.   No wheezes, crackles, or rhonchi.  Cardiovascular: Normal S1, S2. No MRG. Regular rate and rhythm. No peripheral edema, cyanosis or pallor.  Gastrointestinal:  Soft, nondistended, nontender. No rebound or guarding. Normal bowel sounds. No appreciable masses or hepatomegaly. Rectal:  Not performed.  Msk:  Symmetrical without gross deformities. Without edema, no deformity  or joint abnormality.  Neurologic:  Alert and  oriented x4;  grossly normal neurologically.  Skin:   Dry and intact without significant lesions or rashes. Psychiatric: Demonstrates good judgement and reason without abnormal affect or behaviors.  See HPI for recent labs.  Assessment: 1.  Positive Hemoccult study: Has never had a colonoscopy, Hemoccults recently positive x2; consider hemorrhoids versus polyps versus other 2.  Chronic anticoagulation for A. fib: On Xarelto  Plan: 1.  Patient is ready to have her colonoscopy even though she is nervous about holding her Xarelto.  Went ahead and scheduled her with Dr. Silverio Decamp in the [facility].  Did discuss risks,  benefits, limitations and alternatives and the patient agrees to proceed. 2. Patient was advised to hold her Xarelto for 2 days.  Additional rare but real risk of stroke or other vascular clotting events off of Xarelto was also explained, it was recommended to seek urgent help if any signs of these problems occur.  We will communicate with his referring physician via phone or EMR to ensure that holding her Xarelto is reasonable for her. 3.  Patient to follow in clinic per recommendations from Dr. Silverio Decamp after time of procedure.  Ellouise Newer, PA-C [facility] 10/25/2019, 10:48 AM  Cc: Leighton Ruff, MD

## 2019-10-25 NOTE — Telephone Encounter (Signed)
Patient with diagnosis of atrial fibrillation on Xarelto for anticoagulation.    Procedure: colonoscopy Date of procedure: 11/23/19  CHADS2-VASc score of  3 (AGE  x 2, female)  CrCl 108.7 Platelet count 322  Per office protocol, patient can hold Xarelto for 2 days prior to procedure.

## 2019-10-26 NOTE — Telephone Encounter (Signed)
Called and left detailed message that she can hold Xarelto 2 days before procedure, 7-19 and 7-20.  Asked her to call back to confirm receipt and answer any questions she has.

## 2019-10-26 NOTE — Telephone Encounter (Signed)
Called pt back. She needed to reschedule her procedure to Monday 11-28-19 at 2:30pm due to lack of transportation. We went through her new arrival time and prep instructions times. She expressed understanding.  She expressed understanding to hold Xarelto on Sat and Sun 7/24 and 7/25. She agreed to call back with any additional questions

## 2019-10-31 DIAGNOSIS — M85852 Other specified disorders of bone density and structure, left thigh: Secondary | ICD-10-CM | POA: Diagnosis not present

## 2019-10-31 DIAGNOSIS — Z780 Asymptomatic menopausal state: Secondary | ICD-10-CM | POA: Diagnosis not present

## 2019-10-31 DIAGNOSIS — Z1231 Encounter for screening mammogram for malignant neoplasm of breast: Secondary | ICD-10-CM | POA: Diagnosis not present

## 2019-10-31 DIAGNOSIS — M85851 Other specified disorders of bone density and structure, right thigh: Secondary | ICD-10-CM | POA: Diagnosis not present

## 2019-11-03 NOTE — Progress Notes (Signed)
Reviewed and agree with documentation and assessment and plan. K. Veena Jentzen Minasyan , MD   

## 2019-11-15 ENCOUNTER — Encounter: Payer: Self-pay | Admitting: Gastroenterology

## 2019-11-23 ENCOUNTER — Encounter: Payer: PPO | Admitting: Gastroenterology

## 2019-11-23 DIAGNOSIS — R921 Mammographic calcification found on diagnostic imaging of breast: Secondary | ICD-10-CM | POA: Diagnosis not present

## 2019-11-28 ENCOUNTER — Encounter: Payer: Self-pay | Admitting: Gastroenterology

## 2019-11-28 ENCOUNTER — Other Ambulatory Visit: Payer: Self-pay

## 2019-11-28 ENCOUNTER — Ambulatory Visit (AMBULATORY_SURGERY_CENTER): Payer: PPO | Admitting: Gastroenterology

## 2019-11-28 VITALS — BP 145/88 | HR 107 | Temp 96.2°F | Resp 20 | Ht 62.0 in | Wt 216.0 lb

## 2019-11-28 DIAGNOSIS — R195 Other fecal abnormalities: Secondary | ICD-10-CM | POA: Diagnosis not present

## 2019-11-28 DIAGNOSIS — K649 Unspecified hemorrhoids: Secondary | ICD-10-CM

## 2019-11-28 DIAGNOSIS — K5730 Diverticulosis of large intestine without perforation or abscess without bleeding: Secondary | ICD-10-CM | POA: Diagnosis not present

## 2019-11-28 DIAGNOSIS — I10 Essential (primary) hypertension: Secondary | ICD-10-CM | POA: Diagnosis not present

## 2019-11-28 DIAGNOSIS — K642 Third degree hemorrhoids: Secondary | ICD-10-CM

## 2019-11-28 DIAGNOSIS — I4891 Unspecified atrial fibrillation: Secondary | ICD-10-CM | POA: Diagnosis not present

## 2019-11-28 MED ORDER — HYDROCORTISONE ACETATE 25 MG RE SUPP: 25.0000 mg | RECTAL | 0 refills | Status: AC

## 2019-11-28 MED ORDER — SODIUM CHLORIDE 0.9 % IV SOLN: 500.0000 mL | Freq: Once | INTRAVENOUS | Status: DC

## 2019-11-28 NOTE — Progress Notes (Signed)
Post procedure Dr Silverio Decamp went to inquire abut injecting the pt hemorrhoids.  Pt kept in room till a decision made

## 2019-11-28 NOTE — Progress Notes (Signed)
Report to PACU, RN, vss, BBS= Clear.  

## 2019-11-28 NOTE — Op Note (Addendum)
[facility] Patient Name: Janice David Procedure Date: 11/28/2019 2:05 PM MRN: [id number] Endoscopist: Mauri Pole , MD Age: 79 Referring MD:  Date of Birth: 12-05-1940 Gender: Female Account #: [id number] Procedure:                Colonoscopy Indications:              Evaluation of unexplained GI bleeding presenting                            with fecal occult blood Medicines:                Monitored Anesthesia Care Procedure:                Pre-Anesthesia Assessment:                           - Prior to the procedure, a History and Physical                            was performed, and patient medications and                            allergies were reviewed. The patient's tolerance of                            previous anesthesia was also reviewed. The risks                            and benefits of the procedure and the sedation                            options and risks were discussed with the patient.                            All questions were answered, and informed consent                            was obtained. Prior Anticoagulants: The patient                            last took Xarelto (rivaroxaban) 2 days prior to the                            procedure. ASA Grade Assessment: III - A patient                            with severe systemic disease. After reviewing the                            risks and benefits, the patient was deemed in                            satisfactory condition to undergo the procedure.  After obtaining informed consent, the colonoscope                            was passed under direct vision. Throughout the                            procedure, the patient's blood pressure, pulse, and                            oxygen saturations were monitored continuously. The                            Colonoscope was introduced through the anus and                            advanced to the the cecum,  identified by                            appendiceal orifice and ileocecal valve. The                            colonoscopy was performed without difficulty. The                            patient tolerated the procedure well. The quality                            of the bowel preparation was excellent. The                            ileocecal valve, appendiceal orifice, and rectum                            were photographed. Scope In: 2:18:43 PM Scope Out: 2:35:31 PM Scope Withdrawal Time: 0 hours 10 minutes 53 seconds  Total Procedure Duration: 0 hours 16 minutes 48 seconds  Findings:                 Hemorrhoids were found on perianal exam.                           Scattered small and large-mouthed diverticula were                            found in the sigmoid colon, descending colon,                            transverse colon and ascending colon.                           Bleeding prolapsed external and internal                            hemorrhoids were found during retroflexion. The  hemorrhoids were large.                           The exam was otherwise without abnormality. Complications:            No immediate complications. Estimated Blood Loss:     Estimated blood loss was minimal. Impression:               - Hemorrhoids found on perianal exam.                           - Moderate diverticulosis in the sigmoid colon, in                            the descending colon, in the transverse colon and                            in the ascending colon.                           - Bleeding prolapsed external and internal                            hemorrhoids.                           - The examination was otherwise normal.                           - No specimens collected. Recommendation:           - Patient has a contact number available for                            emergencies. The signs and symptoms of potential                             delayed complications were discussed with the                            patient. Return to normal activities tomorrow.                            Written discharge instructions were provided to the                            patient.                           - Resume previous diet.                           - Continue present medications.                           - No repeat colonoscopy due to age and the absence  of colonic polyps.                           - Refer to a colo-rectal surgeon at appointment to                            be scheduled for symptomatic bleeding hemorrhoids.                           - Use hydrocortisone suppository 25 mg 1 per rectum                            once a day for 7 days.                           - Resume Xarelto (rivaroxaban) at prior dose in 1-2                            days if no further bleeding. Mauri Pole, MD 11/28/2019 2:45:38 PM This report has been signed electronically.

## 2019-11-28 NOTE — Patient Instructions (Signed)
Please read handouts provided. Continue present medications. Resume Xarelto ( rivaroxaban ) at prior dose in 2 days if no further bleeding. Use hydrocortisone suppository 25 mg 1 per rectum once a day for 7 days.      YOU HAD AN ENDOSCOPIC PROCEDURE TODAY AT [facility]:   Refer to the procedure report that was given to you for any specific questions about what was found during the examination.  If the procedure report does not answer your questions, please call your gastroenterologist to clarify.  If you requested that your care partner not be given the details of your procedure findings, then the procedure report has been included in a sealed envelope for you to review at your convenience later.  YOU SHOULD EXPECT: Some feelings of bloating in the abdomen. Passage of more gas than usual.  Walking can help get rid of the air that was put into your GI tract during the procedure and reduce the bloating. If you had a lower endoscopy (such as a colonoscopy or flexible sigmoidoscopy) you may notice spotting of blood in your stool or on the toilet paper. If you underwent a bowel prep for your procedure, you may not have a normal bowel movement for a few days.  Please Note:  You might notice some irritation and congestion in your nose or some drainage.  This is from the oxygen used during your procedure.  There is no need for concern and it should clear up in a day or so.  SYMPTOMS TO REPORT IMMEDIATELY:   Following lower endoscopy (colonoscopy or flexible sigmoidoscopy):  Excessive amounts of blood in the stool  Significant tenderness or worsening of abdominal pains  Swelling of the abdomen that is new, acute  Fever of 100F or higher   For urgent or emergent issues, a gastroenterologist can be reached at any hour by calling [phone]. Do not use MyChart messaging for urgent concerns.    DIET:  We do recommend a small meal at first, but then you may proceed to your  regular diet.  Drink plenty of fluids but you should avoid alcoholic beverages for 24 hours.  ACTIVITY:  You should plan to take it easy for the rest of today and you should NOT DRIVE or use heavy machinery until tomorrow (because of the sedation medicines used during the test).    FOLLOW UP: Our staff will call the number listed on your records 48-72 hours following your procedure to check on you and address any questions or concerns that you may have regarding the information given to you following your procedure. If we do not reach you, we will leave a message.  We will attempt to reach you two times.  During this call, we will ask if you have developed any symptoms of COVID 19. If you develop any symptoms (ie: fever, flu-like symptoms, shortness of breath, cough etc.) before then, please call [phone].  If you test positive for Covid 19 in the 2 weeks post procedure, please call and report this information to [place].    If any biopsies were taken you will be contacted by phone or by letter within the next 1-3 weeks.  Please call us at [phone] if you have not heard about the biopsies in 3 weeks.    SIGNATURES/CONFIDENTIALITY: You and/or your care partner have signed paperwork which will be entered into your electronic medical record.  These signatures attest to the fact that that the information above on your After Visit  Summary has been reviewed and is understood.  Full responsibility of the confidentiality of this discharge information lies with you and/or your care-partner. 

## 2019-11-28 NOTE — Progress Notes (Signed)
Pt's states no medical or surgical changes since previsit or office visit.  SP - vitals 

## 2019-11-29 ENCOUNTER — Telehealth: Payer: Self-pay

## 2019-11-29 NOTE — Telephone Encounter (Signed)
Medical records faxed to [facility] for referral to female surgeon for treatment of symptomatic bleeding hemorrhoids.

## 2019-11-30 ENCOUNTER — Telehealth: Payer: Self-pay

## 2019-11-30 NOTE — Telephone Encounter (Signed)
First post procedure follow up call, no answer 

## 2019-11-30 NOTE — Telephone Encounter (Signed)
  Follow up Call-  Call back number 11/28/2019  Post procedure Call Back phone  # [phone]  Permission to leave phone message Yes  Some recent data might be hidden     Patient questions:   Do you have a fever, pain , or abdominal swelling? No. Pain Score  0 *  Have you tolerated food without any problems? Yes.    Have you been able to return to your normal activities? Yes.    Do you have any questions about your discharge instructions: Diet   No. Medications  No. Follow up visit  No.  Do you have questions or concerns about your Care? No.  Actions: * If pain score is 4 or above: No action needed, pain <4.  1. Have you developed a fever since your procedure? no  2.   Have you had an respiratory symptoms (SOB or cough) since your procedure? no  3.   Have you tested positive for COVID 19 since your procedure no  4.   Have you had any family members/close contacts diagnosed with the COVID 19 since your procedure?  no   If yes to any of these questions please route to Joylene John, RN and Erenest Rasher, RN

## 2020-01-12 DIAGNOSIS — K641 Second degree hemorrhoids: Secondary | ICD-10-CM | POA: Diagnosis not present

## 2020-01-13 DIAGNOSIS — H04123 Dry eye syndrome of bilateral lacrimal glands: Secondary | ICD-10-CM | POA: Diagnosis not present

## 2020-01-13 DIAGNOSIS — H40013 Open angle with borderline findings, low risk, bilateral: Secondary | ICD-10-CM | POA: Diagnosis not present

## 2020-01-13 DIAGNOSIS — H35373 Puckering of macula, bilateral: Secondary | ICD-10-CM | POA: Diagnosis not present

## 2020-01-13 DIAGNOSIS — Z961 Presence of intraocular lens: Secondary | ICD-10-CM | POA: Diagnosis not present

## 2020-02-10 ENCOUNTER — Telehealth: Payer: Self-pay | Admitting: Gastroenterology

## 2020-02-10 NOTE — Telephone Encounter (Signed)
The patient saw the colorectal surgeon who offered hemorrhoidal band stating she has a 50/50 % chance of success. The patient is aware you also do the banding procedure. She asks your thoughts on if the banding is a good option. Thanks

## 2020-02-10 NOTE — Telephone Encounter (Signed)
Given she has large prolapsed hemorrhoids, banding may or may not be effective but is less invasive and is worth trying. Follow up with [facility]. Thanks

## 2020-02-13 IMAGING — MG DIGITAL SCREENING BILATERAL MAMMOGRAM WITH TOMO AND CAD
8 series · 9 of 24 positions shown · non-contrast
Comparison: Previous exam(s).

CLINICAL DATA: Screening.

EXAM:
DIGITAL SCREENING BILATERAL MAMMOGRAM WITH TOMO AND CAD

[L MLO synth-2D]
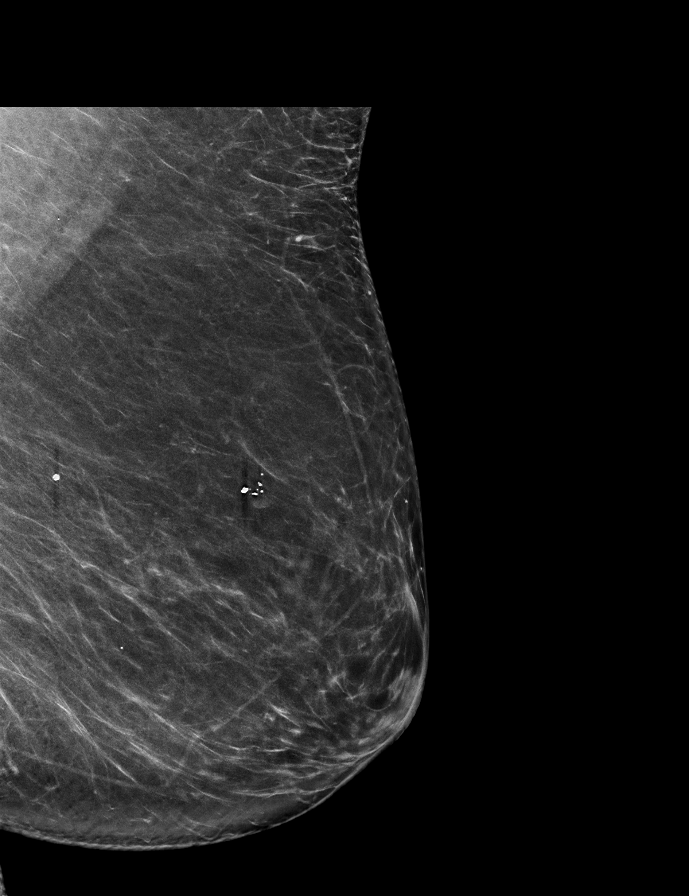

[R MLO synth-2D]
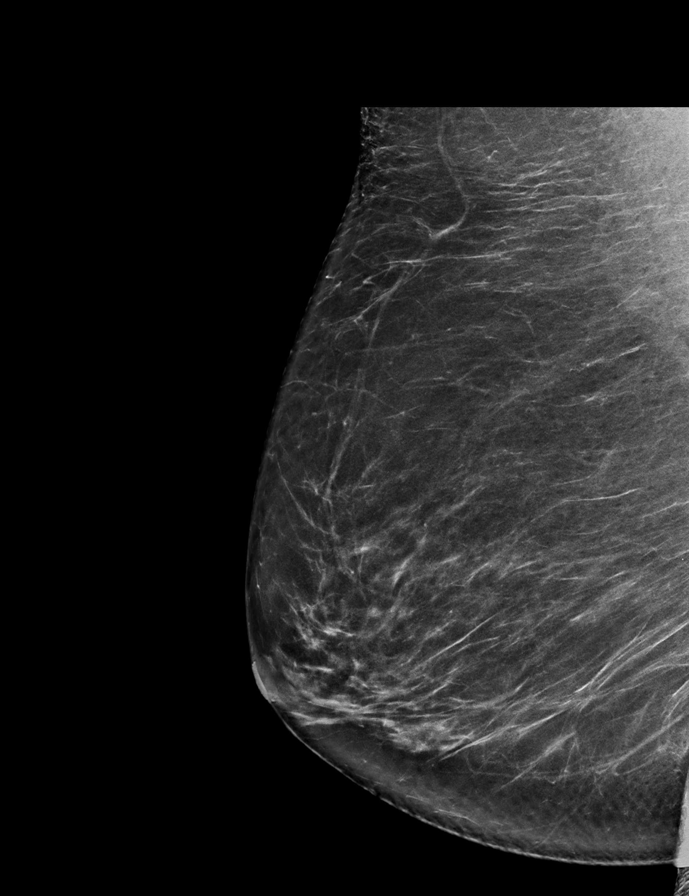

[R CC synth-2D]
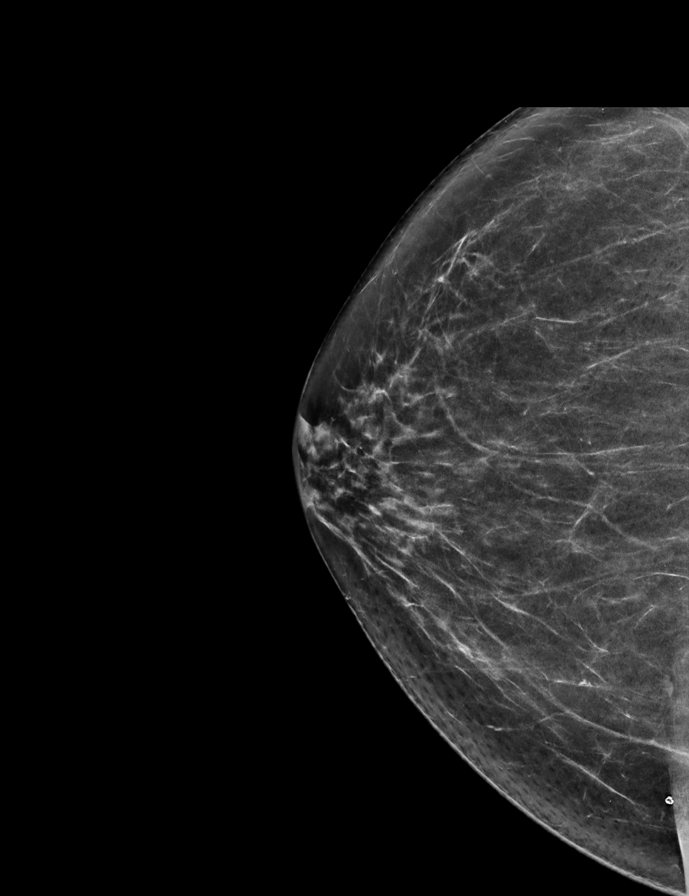

[L CC synth-2D]
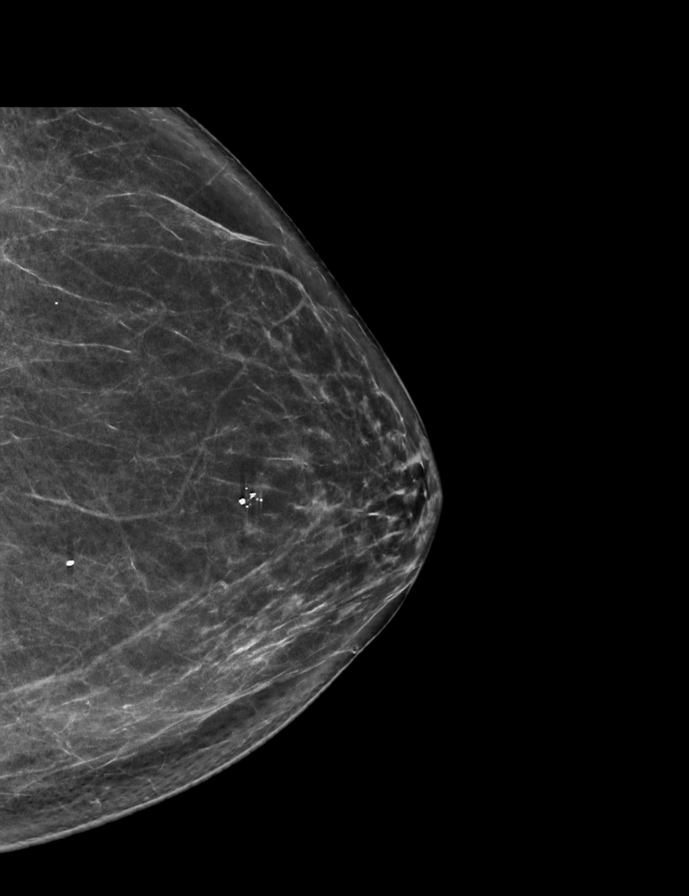

[L MLO tomo · 2 of 79 frames shown]
[frame 26/79]
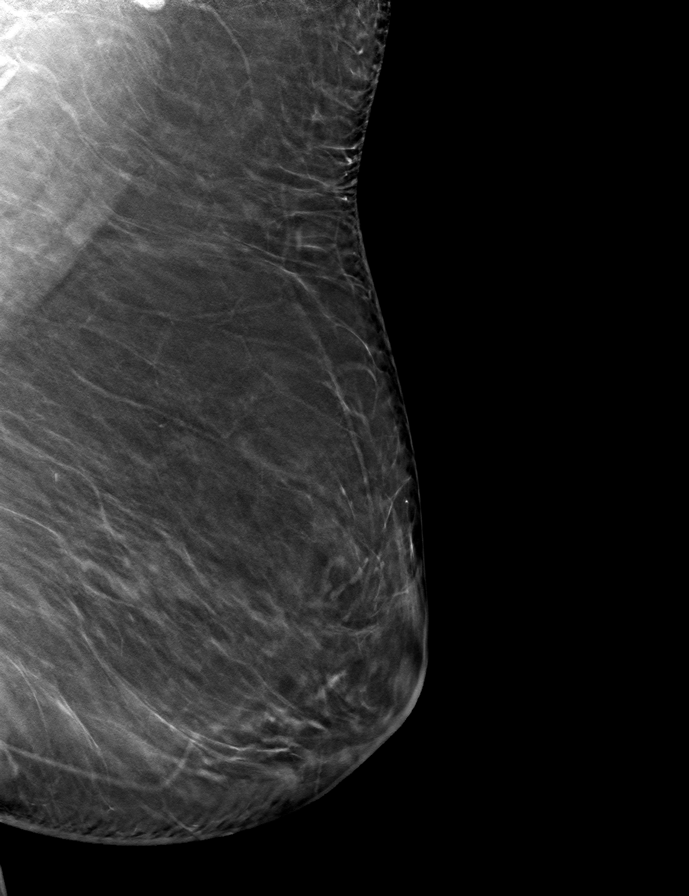
[frame 40/79]
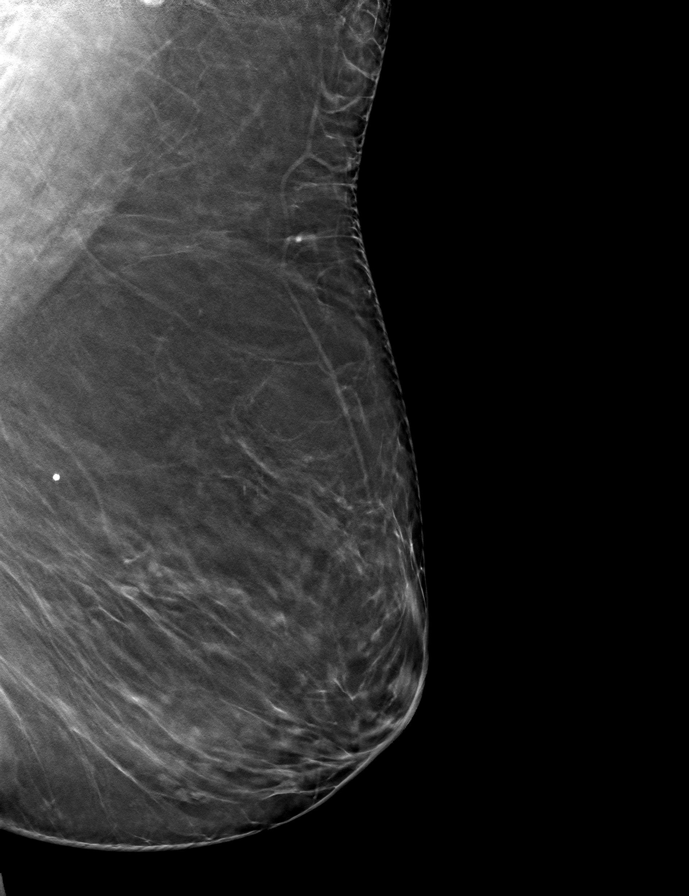

[L CC tomo · tomo slice 35/69.0]
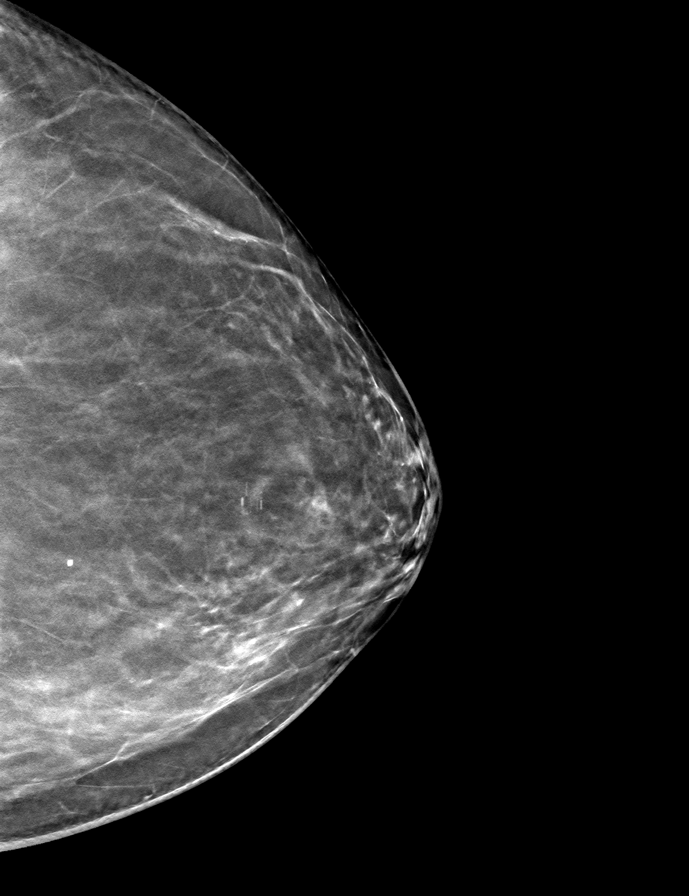

[R CC tomo · tomo slice 35/69.0]
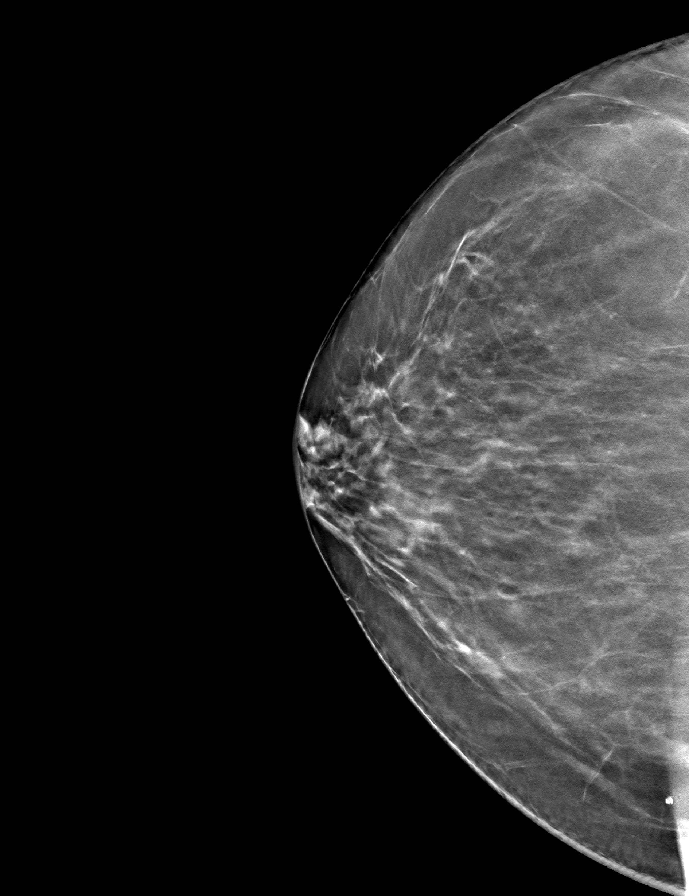

[R MLO tomo · tomo slice 42/83.0]
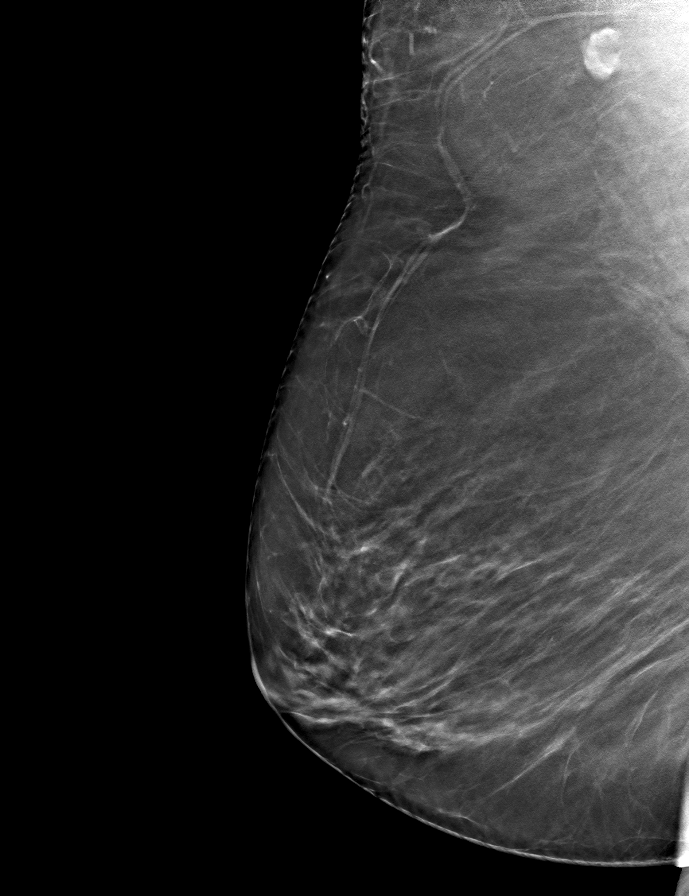

[9 of 24 positions shown; findings below may reference images not displayed]

ACR Breast Density Category b: There are scattered areas of
fibroglandular density.
FINDINGS: There are no findings suspicious for malignancy. Images were
processed with CAD.
IMPRESSION: No mammographic evidence of malignancy. A result letter of this
screening mammogram will be mailed directly to the patient.

RECOMMENDATION:
Screening mammogram in one year. (Code:[id number])

BI-RADS CATEGORY  1: Negative.

## 2020-02-13 NOTE — Telephone Encounter (Signed)
Called the patient. No answer. Left her this information on her voicemail.

## 2020-02-14 ENCOUNTER — Ambulatory Visit (HOSPITAL_COMMUNITY): Payer: PPO | Admitting: Nurse Practitioner

## 2020-02-16 DIAGNOSIS — D485 Neoplasm of uncertain behavior of skin: Secondary | ICD-10-CM | POA: Diagnosis not present

## 2020-02-16 DIAGNOSIS — L570 Actinic keratosis: Secondary | ICD-10-CM | POA: Diagnosis not present

## 2020-02-16 DIAGNOSIS — L821 Other seborrheic keratosis: Secondary | ICD-10-CM | POA: Diagnosis not present

## 2020-02-16 DIAGNOSIS — L812 Freckles: Secondary | ICD-10-CM | POA: Diagnosis not present

## 2020-02-16 DIAGNOSIS — C4441 Basal cell carcinoma of skin of scalp and neck: Secondary | ICD-10-CM | POA: Diagnosis not present

## 2020-02-16 DIAGNOSIS — Z85828 Personal history of other malignant neoplasm of skin: Secondary | ICD-10-CM | POA: Diagnosis not present

## 2020-02-20 ENCOUNTER — Other Ambulatory Visit (HOSPITAL_COMMUNITY): Payer: Self-pay | Admitting: *Deleted

## 2020-02-20 ENCOUNTER — Encounter (HOSPITAL_COMMUNITY): Payer: Self-pay | Admitting: Nurse Practitioner

## 2020-02-20 ENCOUNTER — Ambulatory Visit (HOSPITAL_COMMUNITY)
Admission: RE | Admit: 2020-02-20 | Discharge: 2020-02-20 | Disposition: A | Discharge status: Home / Self Care | Payer: PPO | Source: Ambulatory Visit | Attending: Nurse Practitioner | Admitting: Nurse Practitioner

## 2020-02-20 ENCOUNTER — Other Ambulatory Visit: Payer: Self-pay

## 2020-02-20 VITALS — BP 132/70 | HR 93 | Ht 62.0 in | Wt 216.6 lb

## 2020-02-20 DIAGNOSIS — E785 Hyperlipidemia, unspecified: Secondary | ICD-10-CM | POA: Insufficient documentation

## 2020-02-20 DIAGNOSIS — Z7901 Long term (current) use of anticoagulants: Secondary | ICD-10-CM | POA: Insufficient documentation

## 2020-02-20 DIAGNOSIS — Z79890 Hormone replacement therapy (postmenopausal): Secondary | ICD-10-CM | POA: Diagnosis not present

## 2020-02-20 DIAGNOSIS — I4821 Permanent atrial fibrillation: Secondary | ICD-10-CM | POA: Diagnosis not present

## 2020-02-20 DIAGNOSIS — D6869 Other thrombophilia: Secondary | ICD-10-CM

## 2020-02-20 DIAGNOSIS — R609 Edema, unspecified: Secondary | ICD-10-CM | POA: Diagnosis not present

## 2020-02-20 DIAGNOSIS — I482 Chronic atrial fibrillation, unspecified: Secondary | ICD-10-CM | POA: Diagnosis not present

## 2020-02-20 DIAGNOSIS — I10 Essential (primary) hypertension: Secondary | ICD-10-CM | POA: Insufficient documentation

## 2020-02-20 DIAGNOSIS — Z79899 Other long term (current) drug therapy: Secondary | ICD-10-CM | POA: Insufficient documentation

## 2020-02-20 DIAGNOSIS — E039 Hypothyroidism, unspecified: Secondary | ICD-10-CM | POA: Insufficient documentation

## 2020-02-20 MED ORDER — METOPROLOL TARTRATE 25 MG PO TABS: 25.0000 mg | ORAL_TABLET | Freq: Two times a day (BID) | ORAL | 2 refills | Status: DC

## 2020-02-20 MED ORDER — VERAPAMIL HCL ER 120 MG PO TBCR: 120.0000 mg | EXTENDED_RELEASE_TABLET | Freq: Every day | ORAL | 2 refills | Status: DC

## 2020-02-20 NOTE — Progress Notes (Signed)
Primary Care Physician: Leighton Ruff, MD Referring Physician: Dr. Denver Faster Janice David is a 79 y.o. female with a h/o chronic afib that is in the afib clinic for f/u.Has been in chronic afib since 2014 and none of the options may benefit pt at this point. She has seen Dr. Rayann Heman in the past  and he said that she was not an ablation candidate.  He thought at that time pt may be best rate controlled. She is c/o intermittent chest pressure/left arm discomfort associated with belching that she is concerned about.  F/u in afib clinic, 08/11/19, she remains in rate controlled afib. Noticed some mild ankle edema over the last week. Admits to some mild ankle edema but has dangled a lot of this week and has increased salt intake. Labs from October with PCP showed creatinine  at 0.76 and  BUN at 11.  F/u in afib clinic, 02/20/20. She is longstanding persistent afib. She is rate controlled. She is c/o of fatigue and shortness of breath with exertion but is very sedentary. Has been found to have hemorrhoids and may be pending surgery for that. Has some chronic LLE, does not want to use a prn fluid pill. Will try low salt diet , elevation of feet and supportive socks.  Today, she denies symptoms of palpitations, chest pain, orthopnea, PND, lower extremity edema, dizziness, presyncope, syncope, or neurologic sequela. Chronic shortness of breath. The patient is tolerating medications without difficulties and is otherwise without complaint today.   Past Medical History:  Diagnosis Date  . Anemia   . Asthma   . Atrial fibrillation ([place])   . Cataracta   . Diverticulosis   . GERD (gastroesophageal reflux disease)   . HLD (hyperlipidemia)   . Hypothyroidism   . Internal hemorrhoids   . Osteopenia   . Vaginal polyp   . Vitamin D deficiency    Past Surgical History:  Procedure Laterality Date  . ABDOMINAL HYSTERECTOMY  05/28/81   oavarian cyst and appendix removed  . birth mark surgery  2/81,  1976  . BREAST EXCISIONAL BIOPSY Left    benign  . CARDIOVERSION N/A 03/25/2013   Procedure: CARDIOVERSION;  Surgeon: Darlin Coco, MD;  Location: [facility];  Service: Cardiovascular;  Laterality: N/A;  . CATARACT EXTRACTION, BILATERAL    . CESAREAN SECTION  02/07/70  . DILATION AND CURETTAGE OF UTERUS  03/20/80  . KNEE ARTHROSCOPY Left 01/23/03  . LAPAROSCOPY  1979  . TONSILLECTOMY AND ADENOIDECTOMY    . TUBAL LIGATION      Current Outpatient Medications  Medication Sig Dispense Refill  . cholecalciferol (VITAMIN D) 1000 UNITS tablet Patient taking 4 caps (4000 units) daily by mouth.    . levothyroxine (SYNTHROID, LEVOTHROID) 50 MCG tablet Take 50 mcg by mouth daily before breakfast.    . metoprolol tartrate (LOPRESSOR) 25 MG tablet Take 1 tablet (25 mg total) by mouth 2 (two) times daily. 180 tablet 2  . verapamil (CALAN-SR) 120 MG CR tablet Take 1 tablet (120 mg total) by mouth at bedtime. 90 tablet 2  . XARELTO 20 MG TABS tablet Take 1 tablet by mouth daily with supper 90 tablet 2  . calcium carbonate (TUMS - DOSED IN MG ELEMENTAL CALCIUM) 500 MG chewable tablet Chew 1 tablet by mouth as needed for indigestion or heartburn. (Patient not taking: Reported on 02/20/2020)     No current facility-administered medications for this encounter.    Allergies  Allergen Reactions  . Diltiazem Hcl  IV was OK, oral med caused nausea and possibly diarrhea.  . Sulfonamide Derivatives     Unknown reaction, possible hives/sores    Social History   Socioeconomic History  . Marital status: Divorced    Spouse name: Not on file  . Number of children: 2  . Years of education: Not on file  . Highest education level: Not on file  Occupational History  . Occupation: retired  Tobacco Use  . Smoking status: Never Smoker  . Smokeless tobacco: Never Used  Vaping Use  . Vaping Use: Never used  Substance and Sexual Activity  . Alcohol use: Yes    Comment: occassionally  . Drug use: No   . Sexual activity: Not on file  Other Topics Concern  . Not on file  Social History Narrative  . Not on file   Social Determinants of Health   Financial Resource Strain:   . Difficulty of Paying Living Expenses: Not on file  Food Insecurity:   . Worried About Charity fundraiser in the Last Year: Not on file  . Ran Out of Food in the Last Year: Not on file  Transportation Needs:   . Lack of Transportation (Medical): Not on file  . Lack of Transportation (Non-Medical): Not on file  Physical Activity:   . Days of Exercise per Week: Not on file  . Minutes of Exercise per Session: Not on file  Stress:   . Feeling of Stress : Not on file  Social Connections:   . Frequency of Communication with Friends and Family: Not on file  . Frequency of Social Gatherings with Friends and Family: Not on file  . Attends Religious Services: Not on file  . Active Member of Clubs or Organizations: Not on file  . Attends Archivist Meetings: Not on file  . Marital Status: Not on file  Intimate Partner Violence:   . Fear of Current or Ex-Partner: Not on file  . Emotionally Abused: Not on file  . Physically Abused: Not on file  . Sexually Abused: Not on file    Family History  Problem Relation Age of Onset  . Skin cancer Father   . Heart disease Mother   . Colon cancer Neg Hx   . Rectal cancer Neg Hx   . Stomach cancer Neg Hx   . Esophageal cancer Neg Hx     ROS- All systems are reviewed and negative except as per the HPI above  Physical Exam: Vitals:   02/20/20 1346  BP: 132/70  Pulse: 93  Weight: 98.2 kg  Height: 5\' 2"  (1.575 m)   Wt Readings from Last 3 Encounters:  02/20/20 98.2 kg  11/28/19 (!) 98 kg  10/25/19 98 kg    Labs: Lab Results  Component Value Date   NA 132 (L) 04/24/2017   K 5.1 04/24/2017   CL 95 (L) 04/24/2017   CO2 25 04/24/2017   GLUCOSE 88 04/24/2017   BUN 13 04/24/2017   CREATININE 0.75 04/24/2017   CALCIUM 10.6 (H) 04/24/2017   MG 2.3  02/10/2013   Lab Results  Component Value Date   INR 1.10 02/09/2013   Lab Results  Component Value Date   CHOL 177 02/10/2013   HDL 65 02/10/2013   LDLCALC 93 02/10/2013   TRIG 95 02/10/2013     GEN- The patient is well appearing, alert and oriented x 3 today.   Head- normocephalic, atraumatic Eyes-  Sclera clear, conjunctiva pink Ears- hearing intact Oropharynx- clear  Neck- supple, no JVP Lymph- no cervical lymphadenopathy Lungs- Clear to ausculation bilaterally, normal work of breathing Heart- irregular rate and rhythm, no murmurs, rubs or gallops, PMI not laterally displaced GI- soft, NT, ND, + BS Extremities- no clubbing, cyanosis, or edema MS- no significant deformity or atrophy Skin- no rash or lesion Psych- euthymic mood, full affect Neuro- strength and sensation are intact  EKG- afib at 93, qrs int 78 ms, 442 ms qtc Echo-Study Conclusions  - Left ventricle: The cavity size was normal. Wall thickness was   normal. Systolic function was normal. The estimated ejection   fraction was in the range of 50% to 55%. Wall motion was normal;   there were no regional wall motion abnormalities. - Mitral valve: Calcified annulus. - Left atrium: The atrium was mildly dilated. - Right atrium: The atrium was mildly dilated.  Impressions:  - Normal LV function; mild biatrial enlargement; trace MR and TR.   Assessment and Plan: 1.Long standing permanent  afib Rate controlled  Rate control is her only option going forward as she has been in afib x 6-7 years Continue rate control with CCB, BB.  Continue xarelto with chadsvasc score of at least 3  2. HTN Stable   3. LLE Avoid salt Elevate feet when sitting  Knee high support socks  Can prescribe diuretic if needed  She is  hesitate at this point to do so    F/u in 6 months with Dr. Caryl Comes at pt's request    Janice David, [facility] [address] [place], [place]  [id number] [phone]

## 2020-03-05 DIAGNOSIS — K219 Gastro-esophageal reflux disease without esophagitis: Secondary | ICD-10-CM | POA: Diagnosis not present

## 2020-03-05 DIAGNOSIS — Z131 Encounter for screening for diabetes mellitus: Secondary | ICD-10-CM | POA: Diagnosis not present

## 2020-03-05 DIAGNOSIS — I10 Essential (primary) hypertension: Secondary | ICD-10-CM | POA: Diagnosis not present

## 2020-03-05 DIAGNOSIS — R350 Frequency of micturition: Secondary | ICD-10-CM | POA: Diagnosis not present

## 2020-03-05 DIAGNOSIS — E7800 Pure hypercholesterolemia, unspecified: Secondary | ICD-10-CM | POA: Diagnosis not present

## 2020-03-05 DIAGNOSIS — R2681 Unsteadiness on feet: Secondary | ICD-10-CM | POA: Diagnosis not present

## 2020-03-05 DIAGNOSIS — Z7901 Long term (current) use of anticoagulants: Secondary | ICD-10-CM | POA: Diagnosis not present

## 2020-03-05 DIAGNOSIS — F419 Anxiety disorder, unspecified: Secondary | ICD-10-CM | POA: Diagnosis not present

## 2020-03-05 DIAGNOSIS — E559 Vitamin D deficiency, unspecified: Secondary | ICD-10-CM | POA: Diagnosis not present

## 2020-03-05 DIAGNOSIS — E039 Hypothyroidism, unspecified: Secondary | ICD-10-CM | POA: Diagnosis not present

## 2020-03-05 DIAGNOSIS — I4891 Unspecified atrial fibrillation: Secondary | ICD-10-CM | POA: Diagnosis not present

## 2020-03-05 DIAGNOSIS — K649 Unspecified hemorrhoids: Secondary | ICD-10-CM | POA: Diagnosis not present

## 2020-04-18 DIAGNOSIS — I4891 Unspecified atrial fibrillation: Secondary | ICD-10-CM | POA: Diagnosis not present

## 2020-04-18 DIAGNOSIS — E039 Hypothyroidism, unspecified: Secondary | ICD-10-CM | POA: Diagnosis not present

## 2020-04-18 DIAGNOSIS — I10 Essential (primary) hypertension: Secondary | ICD-10-CM | POA: Diagnosis not present

## 2020-04-18 DIAGNOSIS — K219 Gastro-esophageal reflux disease without esophagitis: Secondary | ICD-10-CM | POA: Diagnosis not present

## 2020-04-18 DIAGNOSIS — H269 Unspecified cataract: Secondary | ICD-10-CM | POA: Diagnosis not present

## 2020-04-18 DIAGNOSIS — E7800 Pure hypercholesterolemia, unspecified: Secondary | ICD-10-CM | POA: Diagnosis not present

## 2020-04-19 DIAGNOSIS — Z85828 Personal history of other malignant neoplasm of skin: Secondary | ICD-10-CM | POA: Diagnosis not present

## 2020-04-19 DIAGNOSIS — C4441 Basal cell carcinoma of skin of scalp and neck: Secondary | ICD-10-CM | POA: Diagnosis not present

## 2020-04-20 ENCOUNTER — Other Ambulatory Visit: Payer: Self-pay

## 2020-04-20 ENCOUNTER — Ambulatory Visit: Payer: PPO | Admitting: Podiatry

## 2020-04-20 ENCOUNTER — Encounter: Payer: Self-pay | Admitting: Podiatry

## 2020-04-20 DIAGNOSIS — M7751 Other enthesopathy of right foot: Secondary | ICD-10-CM | POA: Diagnosis not present

## 2020-04-20 DIAGNOSIS — M779 Enthesopathy, unspecified: Secondary | ICD-10-CM

## 2020-04-20 DIAGNOSIS — L84 Corns and callosities: Secondary | ICD-10-CM

## 2020-04-20 NOTE — Progress Notes (Signed)
Subjective:   Patient ID: Janice David, female   DOB: 79 y.o.   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

## 2020-06-18 DIAGNOSIS — H269 Unspecified cataract: Secondary | ICD-10-CM | POA: Diagnosis not present

## 2020-06-18 DIAGNOSIS — K219 Gastro-esophageal reflux disease without esophagitis: Secondary | ICD-10-CM | POA: Diagnosis not present

## 2020-06-18 DIAGNOSIS — E7800 Pure hypercholesterolemia, unspecified: Secondary | ICD-10-CM | POA: Diagnosis not present

## 2020-06-18 DIAGNOSIS — I4891 Unspecified atrial fibrillation: Secondary | ICD-10-CM | POA: Diagnosis not present

## 2020-06-18 DIAGNOSIS — I10 Essential (primary) hypertension: Secondary | ICD-10-CM | POA: Diagnosis not present

## 2020-06-18 DIAGNOSIS — E039 Hypothyroidism, unspecified: Secondary | ICD-10-CM | POA: Diagnosis not present

## 2020-07-06 ENCOUNTER — Other Ambulatory Visit (HOSPITAL_COMMUNITY): Payer: Self-pay

## 2020-07-06 MED ORDER — RIVAROXABAN 20 MG PO TABS: 20.0000 mg | ORAL_TABLET | Freq: Every day | ORAL | 3 refills | Status: DC

## 2020-07-12 DIAGNOSIS — Z961 Presence of intraocular lens: Secondary | ICD-10-CM | POA: Diagnosis not present

## 2020-07-12 DIAGNOSIS — H35373 Puckering of macula, bilateral: Secondary | ICD-10-CM | POA: Diagnosis not present

## 2020-07-12 DIAGNOSIS — H40013 Open angle with borderline findings, low risk, bilateral: Secondary | ICD-10-CM | POA: Diagnosis not present

## 2020-07-12 DIAGNOSIS — H04123 Dry eye syndrome of bilateral lacrimal glands: Secondary | ICD-10-CM | POA: Diagnosis not present

## 2020-09-05 DIAGNOSIS — Z0000 Encounter for general adult medical examination without abnormal findings: Secondary | ICD-10-CM | POA: Diagnosis not present

## 2020-09-05 DIAGNOSIS — I482 Chronic atrial fibrillation, unspecified: Secondary | ICD-10-CM | POA: Diagnosis not present

## 2020-09-05 DIAGNOSIS — E039 Hypothyroidism, unspecified: Secondary | ICD-10-CM | POA: Diagnosis not present

## 2020-09-05 DIAGNOSIS — R5381 Other malaise: Secondary | ICD-10-CM | POA: Diagnosis not present

## 2020-09-05 DIAGNOSIS — R7303 Prediabetes: Secondary | ICD-10-CM | POA: Diagnosis not present

## 2020-09-05 DIAGNOSIS — E7800 Pure hypercholesterolemia, unspecified: Secondary | ICD-10-CM | POA: Diagnosis not present

## 2020-09-05 DIAGNOSIS — Z7901 Long term (current) use of anticoagulants: Secondary | ICD-10-CM | POA: Diagnosis not present

## 2020-09-05 DIAGNOSIS — K648 Other hemorrhoids: Secondary | ICD-10-CM | POA: Diagnosis not present

## 2020-09-20 ENCOUNTER — Ambulatory Visit: Payer: PPO | Admitting: Internal Medicine

## 2020-09-20 ENCOUNTER — Other Ambulatory Visit: Payer: Self-pay

## 2020-09-20 ENCOUNTER — Encounter: Payer: Self-pay | Admitting: Internal Medicine

## 2020-09-20 VITALS — BP 132/90 | HR 95 | Ht 62.0 in | Wt 220.8 lb

## 2020-09-20 DIAGNOSIS — R0600 Dyspnea, unspecified: Secondary | ICD-10-CM | POA: Diagnosis not present

## 2020-09-20 DIAGNOSIS — R0609 Other forms of dyspnea: Secondary | ICD-10-CM

## 2020-09-20 DIAGNOSIS — I4819 Other persistent atrial fibrillation: Secondary | ICD-10-CM | POA: Diagnosis not present

## 2020-09-20 MED ORDER — FUROSEMIDE 20 MG PO TABS: 20.0000 mg | ORAL_TABLET | Freq: Every day | ORAL | 0 refills | Status: DC

## 2020-09-20 NOTE — Progress Notes (Signed)
Patient provided with 2 weeks of Xarelto samples. She was given the number to call for Patient Assistance Application.

## 2020-09-20 NOTE — Patient Instructions (Addendum)
Medication Instructions:  Your physician has recommended you make the following change in your medication:   ** Begin Furosemide 20mg  - 1 tablet each morning x 5 days only.  *If you need a refill on your cardiac medications before your next appointment, please call your pharmacy*   Lab Work: None ordered.  If you have labs (blood work) drawn today and your tests are completely normal, you will receive your results only by: Marland Kitchen MyChart Message (if you have MyChart) OR . A paper copy in the mail If you have any lab test that is abnormal or we need to change your treatment, we will call you to review the results.   Testing/Procedures: Your physician has requested that you have an echocardiogram. Echocardiography is a painless test that uses sound waves to create images of your heart. It provides your doctor with information about the size and shape of your heart and how well your heart's chambers and valves are working. This procedure takes approximately one hour. There are no restrictions for this procedure.    Follow-Up: At [facility], you and your health needs are our priority.  As part of our continuing mission to provide you with exceptional heart care, we have created designated Provider Care Teams.  These Care Teams include your primary Cardiologist (physician) and Advanced Practice Providers (APPs -  Physician Assistants and Nurse Practitioners) who all work together to provide you with the care you need, when you need it.  We recommend signing up for the patient portal called "MyChart".  Sign up information is provided on this After Visit Summary.  MyChart is used to connect with patients for Virtual Visits (Telemedicine).  Patients are able to view lab/test results, encounter notes, upcoming appointments, etc.  Non-urgent messages can be sent to your provider as well.   To learn more about what you can do with MyChart, go to [email or web address].    Your next appointment:    Follow up in the Afib clinic with Butch Penny in 3 months

## 2020-09-20 NOTE — Progress Notes (Signed)
Patient Care Team: Leighton Ruff, MD as PCP - General (Family Medicine)   HPI  Janice David is a 80 y.o. female seen in follow-up for atrial fibrillation-permanent.  Rates have been controlled.  It was elected to continue that strategy.  She is anticoagulated with Xarelto   The patient denies chest pain, nocturnal dyspnea, orthopnea.  There have been no palpitations, lightheadedness or syncope.  Complains of shortness of breath with exertion.  Accompanied by fatigue.  Has noted increased weight with finger swelling and hand swelling  No significant bleeding on Xarelto.       DATE TEST    10/14 Echo    EF 55-60 %   10/17 Echo    EF 55-60 %         Thromboembolic risk profile is notable for age-23; gender-1;  her CHADS-VASc score >=3       Date Cr K Hgb  5/22  0.68 4.5            Past Medical History:  Diagnosis Date  . Anemia   . Asthma   . Atrial fibrillation ([place])   . Cataracta   . Diverticulosis   . GERD (gastroesophageal reflux disease)   . HLD (hyperlipidemia)   . Hypothyroidism   . Internal hemorrhoids   . Osteopenia   . Vaginal polyp   . Vitamin D deficiency     Past Surgical History:  Procedure Laterality Date  . ABDOMINAL HYSTERECTOMY  05/28/81   oavarian cyst and appendix removed  . birth mark surgery  2/81, 1976  . BREAST EXCISIONAL BIOPSY Left    benign  . CARDIOVERSION N/A 03/25/2013   Procedure: CARDIOVERSION;  Surgeon: Darlin Coco, MD;  Location: [place];  Service: Cardiovascular;  Laterality: N/A;  . CATARACT EXTRACTION, BILATERAL    . CESAREAN SECTION  02/07/70  . DILATION AND CURETTAGE OF UTERUS  03/20/80  . KNEE ARTHROSCOPY Left 01/23/03  . LAPAROSCOPY  1979  . TONSILLECTOMY AND ADENOIDECTOMY    . TUBAL LIGATION      Current Outpatient Medications  Medication Sig Dispense Refill  . calcium carbonate (TUMS - DOSED IN MG ELEMENTAL CALCIUM) 500 MG chewable tablet Chew 1 tablet by mouth as needed for indigestion or  heartburn.    . cholecalciferol (VITAMIN D) 1000 UNITS tablet Patient taking 4 caps (4000 units) daily by mouth.    . levothyroxine (SYNTHROID, LEVOTHROID) 50 MCG tablet Take 50 mcg by mouth daily before breakfast.    . metoprolol tartrate (LOPRESSOR) 25 MG tablet Take 1 tablet (25 mg total) by mouth 2 (two) times daily. 180 tablet 2  . rivaroxaban (XARELTO) 20 MG TABS tablet Take 1 tablet (20 mg total) by mouth daily with supper. 90 tablet 3  . verapamil (CALAN-SR) 120 MG CR tablet Take 1 tablet (120 mg total) by mouth at bedtime. 90 tablet 2   No current facility-administered medications for this visit.    Allergies  Allergen Reactions  . Sulfa Antibiotics Other (See Comments)  . Diltiazem Hcl     IV was OK, oral med caused nausea and possibly diarrhea.  . Sulfonamide Derivatives     Unknown reaction, possible hives/sores    Review of Systems negative except from HPI and PMH  Physical Exam BP 132/90   Pulse 95   Ht 5\' 2"  (1.575 m)   Wt 220 lb 12.8 oz (100.2 kg)   SpO2 95%   BMI 40.38 kg/m  Well developed and nourished in  no acute distress HENT normal Neck supple with JVP-  10 + HJR the morning year with Clear Regular rate and rhythm, no murmurs or gallops Abd-soft with active BS No Clubbing cyanosis 1+ edema Skin-warm and dry A & Oriented  Grossly normal sensory and motor function  ECG afib 95 -/10/36 Rare PVCs   Assessment and  Plan  Atrial fibrillation-permanent with a controlled ventricular response  HFpEF acute/chronic  Hypertension  Blood pressure is a little bit elevated; she does not check it at home.  Right now we will continue on the combination of verapamil 120 and metoprolol 25 twice daily.  Her atrial fibrillation rates are faster than they had been; however, she is reluctant to have [place] increase the metoprolol as it is associated with fatigue.  Furthermore, she is reluctant to have a stop it  She is volume overloaded.  We will begin a diuretic  furosemide 20 mg x 5 days.  If she does not have significant diuresis we can increase it to 40 mg.  Encourage her to decrease sodium and fluid intake.   With her atrial fibrillation rates been more rapid, and with her worsening HFpEF we will undertake an echocardiograam    Labs were reviewed from [place]

## 2020-09-21 ENCOUNTER — Telehealth: Payer: Self-pay

## 2020-09-21 NOTE — Telephone Encounter (Signed)
**Note De-identified Kessie Croston Obfuscation** -----  **Note De-Identified Bricen Victory Obfuscation** Message from Thora Lance, RN sent at 09/20/2020  5:27 PM EDT ----- Regarding: Patient Assistance Patient given 2 weeks of Xarelto samples and was provided with number for patient assistance application. I put in a note under OV today. Thanks

## 2020-09-21 NOTE — Telephone Encounter (Signed)
**Note De-Identified Hill Mackie Obfuscation** No answer so I left a message on the pts VM asking her to call Wynetta Emery and Wynetta Emery at [phone] to ask questions concerning their Xarelto program and to request a application be mailed to her if it appears she would be eligible to be approved. I did advise that she complete her part of the application once she receives it, obtain any documents required per [employer], and to bring all to Dr Aquilla Hacker office in [place] to drop off and that we will hangle the provider page and will fax all to [employer].  I did leave my name, Dr. Aquilla Hacker office phone number at [facility], and requested that she call me back if she has any questions.

## 2020-09-26 NOTE — Telephone Encounter (Signed)
**Note De-Identified Carma Dwiggins Obfuscation** The pt and I discussed pt asst for Xarelto through [employer]. I gave her their phone number and recommended that she call them with questions concerning their Clarita Crane pt asst program and that if it appears she is eligible to be approved for the program to request that they mail her an application to her home address.   She is aware to complete her part of the application, obtain required documents per [employer], and to bring all to Dr Olin Pia office at [facility] on [address] in [place] to drop off and that we will take care of the provider page of the appliation and will fax all to [employer].  She verbalized understanding to all of the above and thanked me for calling her back to discuss.

## 2020-09-26 NOTE — Telephone Encounter (Signed)
Patient was returning call because she couldn't understand the VM. Please advise

## 2020-09-28 ENCOUNTER — Encounter (HOSPITAL_COMMUNITY): Payer: Self-pay | Admitting: Internal Medicine

## 2020-10-18 ENCOUNTER — Ambulatory Visit (HOSPITAL_COMMUNITY)
Admission: RE | Admit: 2020-10-18 | Discharge: 2020-10-18 | Disposition: A | Discharge status: Home / Self Care | Payer: PPO | Source: Ambulatory Visit | Attending: Internal Medicine | Admitting: Internal Medicine

## 2020-10-18 ENCOUNTER — Other Ambulatory Visit: Payer: Self-pay

## 2020-10-18 DIAGNOSIS — R0609 Other forms of dyspnea: Secondary | ICD-10-CM | POA: Diagnosis not present

## 2020-10-18 DIAGNOSIS — I081 Rheumatic disorders of both mitral and tricuspid valves: Secondary | ICD-10-CM | POA: Diagnosis not present

## 2020-10-18 DIAGNOSIS — I4819 Other persistent atrial fibrillation: Secondary | ICD-10-CM | POA: Insufficient documentation

## 2020-10-18 DIAGNOSIS — R600 Localized edema: Secondary | ICD-10-CM | POA: Insufficient documentation

## 2020-10-18 DIAGNOSIS — E785 Hyperlipidemia, unspecified: Secondary | ICD-10-CM | POA: Insufficient documentation

## 2020-10-18 DIAGNOSIS — I700 Atherosclerosis of aorta: Secondary | ICD-10-CM | POA: Insufficient documentation

## 2020-10-18 NOTE — Progress Notes (Signed)
  Echocardiogram 2D Echocardiogram has been performed.  Jennette Dubin 10/18/2020, 12:01 PM

## 2020-10-19 LAB — ECHOCARDIOGRAM COMPLETE
MV M vel: 4.05 m/s
MV Peak grad: 65.6 mmHg
Radius: 0.4 cm
S' Lateral: 3.5 cm

## 2020-10-30 ENCOUNTER — Telehealth: Payer: Self-pay

## 2020-10-30 NOTE — Telephone Encounter (Signed)
-----   Message from Deboraha Sprang, MD sent at 10/25/2020  4:34 PM EDT ----- Please Inform Patient Echo showed nearly normal and close to stable   heart muscle function

## 2020-10-30 NOTE — Telephone Encounter (Signed)
Spoke with pt and advised per Dr Caryl Comes echo shows nearly normal and stable heart muscle function.  Pt verbalizes understanding and thanked Therapist, sports for the phone call.

## 2020-11-08 ENCOUNTER — Other Ambulatory Visit (HOSPITAL_COMMUNITY): Payer: Self-pay | Admitting: Nurse Practitioner

## 2020-11-14 ENCOUNTER — Other Ambulatory Visit (HOSPITAL_COMMUNITY): Payer: Self-pay | Admitting: Nurse Practitioner

## 2020-11-19 DIAGNOSIS — R921 Mammographic calcification found on diagnostic imaging of breast: Secondary | ICD-10-CM | POA: Diagnosis not present

## 2020-11-19 DIAGNOSIS — R922 Inconclusive mammogram: Secondary | ICD-10-CM | POA: Diagnosis not present

## 2020-11-19 DIAGNOSIS — N644 Mastodynia: Secondary | ICD-10-CM | POA: Diagnosis not present

## 2020-12-04 DIAGNOSIS — E039 Hypothyroidism, unspecified: Secondary | ICD-10-CM | POA: Diagnosis not present

## 2020-12-04 DIAGNOSIS — I10 Essential (primary) hypertension: Secondary | ICD-10-CM | POA: Diagnosis not present

## 2020-12-04 DIAGNOSIS — E7800 Pure hypercholesterolemia, unspecified: Secondary | ICD-10-CM | POA: Diagnosis not present

## 2020-12-04 DIAGNOSIS — K219 Gastro-esophageal reflux disease without esophagitis: Secondary | ICD-10-CM | POA: Diagnosis not present

## 2020-12-04 DIAGNOSIS — I482 Chronic atrial fibrillation, unspecified: Secondary | ICD-10-CM | POA: Diagnosis not present

## 2020-12-25 ENCOUNTER — Ambulatory Visit (HOSPITAL_COMMUNITY)
Admission: RE | Admit: 2020-12-25 | Discharge: 2020-12-25 | Disposition: A | Discharge status: Home / Self Care | Payer: PPO | Source: Ambulatory Visit | Attending: Nurse Practitioner | Admitting: Nurse Practitioner

## 2020-12-25 ENCOUNTER — Other Ambulatory Visit: Payer: Self-pay

## 2020-12-25 VITALS — BP 154/82 | HR 86 | Ht 62.0 in | Wt 215.6 lb

## 2020-12-25 DIAGNOSIS — Z8249 Family history of ischemic heart disease and other diseases of the circulatory system: Secondary | ICD-10-CM | POA: Insufficient documentation

## 2020-12-25 DIAGNOSIS — Z79890 Hormone replacement therapy (postmenopausal): Secondary | ICD-10-CM | POA: Diagnosis not present

## 2020-12-25 DIAGNOSIS — I4819 Other persistent atrial fibrillation: Secondary | ICD-10-CM | POA: Diagnosis not present

## 2020-12-25 DIAGNOSIS — Z7901 Long term (current) use of anticoagulants: Secondary | ICD-10-CM | POA: Insufficient documentation

## 2020-12-25 DIAGNOSIS — D6869 Other thrombophilia: Secondary | ICD-10-CM

## 2020-12-25 DIAGNOSIS — I4821 Permanent atrial fibrillation: Secondary | ICD-10-CM | POA: Diagnosis not present

## 2020-12-25 DIAGNOSIS — Z79899 Other long term (current) drug therapy: Secondary | ICD-10-CM | POA: Diagnosis not present

## 2020-12-25 NOTE — Progress Notes (Signed)
Primary Care Physician: Leighton Ruff, MD Referring Physician: Dr. Denver Faster ZAHRIAH SAKAMOTO is a 80 y.o. female with a h/o chronic afib that is in the afib clinic for f/u.Has been in chronic afib since 2014 and none of the options may benefit pt at this point. She has seen Dr. Rayann Heman in the past  and he said that she was not an ablation candidate.  He thought at that time pt may be best rate controlled. She is c/o intermittent chest pressure/left arm discomfort associated with belching that she is concerned about.  F/u in afib clinic, 08/11/19, she remains in rate controlled afib. Noticed some mild ankle edema over the last week. Admits to some mild ankle edema but has dangled a lot of this week and has increased salt intake. Labs from October with PCP showed creatinine  at 0.76 and  BUN at 11.  F/u in afib clinic, 02/20/20. She is longstanding persistent afib. She is rate controlled. She is c/o of fatigue and shortness of breath with exertion but is very sedentary. Has been found to have hemorrhoids and may be pending surgery for that. Has some chronic LLE, does not want to use a prn fluid pill. Will try low salt diet , elevation of feet and supportive socks.  F/u in afib clinic, 12/25/20. She lives in permanent afib rate controlled. She has many questions re the medical terms on the echo that Dr. Caryl Comes ordered in June. He felt overall the echo was stable. She has chronic pedal edema but is reluctant to take lasix. She states she feels pretty good today.   Today, she denies symptoms of palpitations, chest pain, orthopnea, PND, dizziness, presyncope, syncope, or neurologic sequela. + for Chronic LLE. +shortness of breath. The patient is tolerating medications without difficulties and is otherwise without complaint today.   Past Medical History:  Diagnosis Date   Anemia    Asthma    Atrial fibrillation ([place])    Cataracta    Diverticulosis    GERD (gastroesophageal reflux disease)     HLD (hyperlipidemia)    Hypothyroidism    Internal hemorrhoids    Osteopenia    Vaginal polyp    Vitamin D deficiency    Past Surgical History:  Procedure Laterality Date   ABDOMINAL HYSTERECTOMY  05/28/81   oavarian cyst and appendix removed   birth mark surgery  2/81, 1976   BREAST EXCISIONAL BIOPSY Left    benign   CARDIOVERSION N/A 03/25/2013   Procedure: CARDIOVERSION;  Surgeon: Darlin Coco, MD;  Location: [place];  Service: Cardiovascular;  Laterality: N/A;   CATARACT EXTRACTION, BILATERAL     CESAREAN SECTION  02/07/70   DILATION AND CURETTAGE OF UTERUS  03/20/80   KNEE ARTHROSCOPY Left 01/23/03   LAPAROSCOPY  1979   TONSILLECTOMY AND ADENOIDECTOMY     TUBAL LIGATION      Current Outpatient Medications  Medication Sig Dispense Refill   calcium carbonate (TUMS - DOSED IN MG ELEMENTAL CALCIUM) 500 MG chewable tablet Chew 1 tablet by mouth as needed for indigestion or heartburn.     cholecalciferol (VITAMIN D) 1000 UNITS tablet Patient taking 4 caps (4000 units) daily by mouth.     furosemide (LASIX) 20 MG tablet Take 1 tablet (20 mg total) by mouth daily. (Patient taking differently: Take 20 mg by mouth as needed.) 30 tablet 0   levothyroxine (SYNTHROID, LEVOTHROID) 50 MCG tablet Take 50 mcg by mouth daily before breakfast.     metoprolol tartrate (  LOPRESSOR) 25 MG tablet Take 1 tablet by mouth twice daily 180 tablet 0   rivaroxaban (XARELTO) 20 MG TABS tablet Take 1 tablet (20 mg total) by mouth daily with supper. 90 tablet 3   verapamil (CALAN-SR) 120 MG CR tablet TAKE 1 TABLET BY MOUTH EVERY DAY AT BEDTIME 90 tablet 0   No current facility-administered medications for this encounter.    Allergies  Allergen Reactions   Sulfa Antibiotics Other (See Comments)   Diltiazem Hcl     IV was OK, oral med caused nausea and possibly diarrhea.   Sulfonamide Derivatives     Unknown reaction, possible hives/sores    Social History   Socioeconomic History   Marital  status: Divorced    Spouse name: Not on file   Number of children: 2   Years of education: Not on file   Highest education level: Not on file  Occupational History   Occupation: retired  Tobacco Use   Smoking status: Never   Smokeless tobacco: Never  Vaping Use   Vaping Use: Never used  Substance and Sexual Activity   Alcohol use: Yes    Comment: occassionally   Drug use: No   Sexual activity: Not on file  Other Topics Concern   Not on file  Social History Narrative   Not on file   Social Determinants of Health   Financial Resource Strain: Not on file  Food Insecurity: Not on file  Transportation Needs: Not on file  Physical Activity: Not on file  Stress: Not on file  Social Connections: Not on file  Intimate Partner Violence: Not on file    Family History  Problem Relation Age of Onset   Skin cancer Father    Heart disease Mother    Colon cancer Neg Hx    Rectal cancer Neg Hx    Stomach cancer Neg Hx    Esophageal cancer Neg Hx     ROS- All systems are reviewed and negative except as per the HPI above  Physical Exam: Vitals:   12/25/20 1014  BP: (!) 154/82  Pulse: 86  Weight: 97.8 kg  Height: '5\' 2"'$  (1.575 m)   Wt Readings from Last 3 Encounters:  12/25/20 97.8 kg  09/20/20 100.2 kg  02/20/20 98.2 kg    Labs: Lab Results  Component Value Date   NA 132 (L) 04/24/2017   K 5.1 04/24/2017   CL 95 (L) 04/24/2017   CO2 25 04/24/2017   GLUCOSE 88 04/24/2017   BUN 13 04/24/2017   CREATININE 0.75 04/24/2017   CALCIUM 10.6 (H) 04/24/2017   MG 2.3 02/10/2013   Lab Results  Component Value Date   INR 1.10 02/09/2013   Lab Results  Component Value Date   CHOL 177 02/10/2013   HDL 65 02/10/2013   LDLCALC 93 02/10/2013   TRIG 95 02/10/2013     GEN- The patient is well appearing, alert and oriented x 3 today.   Head- normocephalic, atraumatic Eyes-  Sclera clear, conjunctiva pink Ears- hearing intact Oropharynx- clear Neck- supple, no  JVP Lymph- no cervical lymphadenopathy Lungs- Clear to ausculation bilaterally, normal work of breathing Heart- irregular rate and rhythm, no murmurs, rubs or gallops, PMI not laterally displaced GI- soft, NT, ND, + BS Extremities- no clubbing, cyanosis, or edema MS- no significant deformity or atrophy Skin- no rash or lesion Psych- euthymic mood, full affect Neuro- strength and sensation are intact  EKG- afib at 86, qrs int 80 ms, 416 ms qtc Echo-Study  Conclusions   -   1. Left ventricular ejection fraction, by estimation, is 50 to 55%. The  left ventricle has low normal function. The left ventricle has no regional  wall motion abnormalities. Left ventricular diastolic parameters are  indeterminate.   2. Right ventricular systolic function is normal. The right ventricular  size is normal. There is normal pulmonary artery systolic pressure. The  estimated right ventricular systolic pressure is [id number] mmHg.   3. Left atrial size was moderately dilated.   4. Right atrial size was severely dilated.   5. The mitral valve is normal in structure. Mild to moderate mitral valve  regurgitation. No evidence of mitral stenosis.   6. The aortic valve is grossly normal. There is mild calcification of the  aortic valve. Aortic valve regurgitation is not visualized. No aortic  stenosis is present.   7. The inferior vena cava is normal in size with greater than 50%  respiratory variability, suggesting right atrial pressure of 3 mmHg.    Impressions:   - Normal LV function; mild biatrial enlargement; trace MR and TR.    Assessment and Plan: 1.Long standing permanent  afib Rate controlled  Rate control is her only option going forward as she has been in afib x 6-7 years Continue rate control with CCB, BB.  Continue xarelto with chadsvasc score of at least 3  2. HTN Stable   3. LLE Avoid salt Elevate feet when sitting  Knee high support socks  She has lasix but is reluctant to take it,  encouraged to try lasix at least 1-2 x a week   She is  hesitate at this point to do so    F/u in 9 months with Dr. Caryl Comes at pt's request    [name]. Athziri Freundlich, [facility] [address] [place], [place] [address] [phone]

## 2021-01-17 DIAGNOSIS — H04123 Dry eye syndrome of bilateral lacrimal glands: Secondary | ICD-10-CM | POA: Diagnosis not present

## 2021-01-17 DIAGNOSIS — Z961 Presence of intraocular lens: Secondary | ICD-10-CM | POA: Diagnosis not present

## 2021-01-17 DIAGNOSIS — H40013 Open angle with borderline findings, low risk, bilateral: Secondary | ICD-10-CM | POA: Diagnosis not present

## 2021-01-17 DIAGNOSIS — H35373 Puckering of macula, bilateral: Secondary | ICD-10-CM | POA: Diagnosis not present

## 2021-01-22 DIAGNOSIS — D225 Melanocytic nevi of trunk: Secondary | ICD-10-CM | POA: Diagnosis not present

## 2021-01-22 DIAGNOSIS — L570 Actinic keratosis: Secondary | ICD-10-CM | POA: Diagnosis not present

## 2021-01-22 DIAGNOSIS — Z85828 Personal history of other malignant neoplasm of skin: Secondary | ICD-10-CM | POA: Diagnosis not present

## 2021-01-22 DIAGNOSIS — D2262 Melanocytic nevi of left upper limb, including shoulder: Secondary | ICD-10-CM | POA: Diagnosis not present

## 2021-01-22 DIAGNOSIS — L820 Inflamed seborrheic keratosis: Secondary | ICD-10-CM | POA: Diagnosis not present

## 2021-01-22 DIAGNOSIS — L812 Freckles: Secondary | ICD-10-CM | POA: Diagnosis not present

## 2021-01-22 DIAGNOSIS — L821 Other seborrheic keratosis: Secondary | ICD-10-CM | POA: Diagnosis not present

## 2021-02-04 ENCOUNTER — Other Ambulatory Visit (HOSPITAL_COMMUNITY): Payer: Self-pay | Admitting: Nurse Practitioner

## 2021-02-11 DIAGNOSIS — I482 Chronic atrial fibrillation, unspecified: Secondary | ICD-10-CM | POA: Diagnosis not present

## 2021-02-11 DIAGNOSIS — F419 Anxiety disorder, unspecified: Secondary | ICD-10-CM | POA: Diagnosis not present

## 2021-02-11 DIAGNOSIS — I10 Essential (primary) hypertension: Secondary | ICD-10-CM | POA: Diagnosis not present

## 2021-02-11 DIAGNOSIS — R928 Other abnormal and inconclusive findings on diagnostic imaging of breast: Secondary | ICD-10-CM | POA: Diagnosis not present

## 2021-02-11 DIAGNOSIS — E039 Hypothyroidism, unspecified: Secondary | ICD-10-CM | POA: Diagnosis not present

## 2021-02-14 ENCOUNTER — Encounter: Payer: Self-pay | Admitting: Podiatry

## 2021-02-14 ENCOUNTER — Other Ambulatory Visit: Payer: Self-pay | Admitting: Family Medicine

## 2021-02-14 ENCOUNTER — Other Ambulatory Visit: Payer: Self-pay

## 2021-02-14 ENCOUNTER — Ambulatory Visit: Payer: PPO | Admitting: Podiatry

## 2021-02-14 DIAGNOSIS — Q828 Other specified congenital malformations of skin: Secondary | ICD-10-CM

## 2021-02-14 DIAGNOSIS — R928 Other abnormal and inconclusive findings on diagnostic imaging of breast: Secondary | ICD-10-CM

## 2021-02-15 NOTE — Progress Notes (Signed)
Subjective:   Patient ID: Janice David, female   DOB: 80 y.o.   MRN: [id number]   HPI Patient presents with lesions on both feet thick and painful and make shoe gear difficult   ROS      Objective:  Physical Exam  Neurovascular status intact with keratotic lesions noted of the plantar aspect both feet with lucent course     Assessment:  Chronic lesion formation with pain     Plan:  Debrided lesions bilateral no iatrogenic bleeding reappoint routine care

## 2021-04-18 ENCOUNTER — Ambulatory Visit
Admission: RE | Admit: 2021-04-18 | Discharge: 2021-04-18 | Disposition: A | Discharge status: Home / Self Care | Payer: PPO | Source: Ambulatory Visit | Attending: Family Medicine | Admitting: Family Medicine

## 2021-04-18 ENCOUNTER — Other Ambulatory Visit: Payer: Self-pay | Admitting: Family Medicine

## 2021-04-18 DIAGNOSIS — R921 Mammographic calcification found on diagnostic imaging of breast: Secondary | ICD-10-CM | POA: Diagnosis not present

## 2021-04-18 DIAGNOSIS — R928 Other abnormal and inconclusive findings on diagnostic imaging of breast: Secondary | ICD-10-CM

## 2021-04-19 DIAGNOSIS — K219 Gastro-esophageal reflux disease without esophagitis: Secondary | ICD-10-CM | POA: Diagnosis not present

## 2021-04-19 DIAGNOSIS — I482 Chronic atrial fibrillation, unspecified: Secondary | ICD-10-CM | POA: Diagnosis not present

## 2021-04-19 DIAGNOSIS — E039 Hypothyroidism, unspecified: Secondary | ICD-10-CM | POA: Diagnosis not present

## 2021-04-19 DIAGNOSIS — I10 Essential (primary) hypertension: Secondary | ICD-10-CM | POA: Diagnosis not present

## 2021-04-19 DIAGNOSIS — E7800 Pure hypercholesterolemia, unspecified: Secondary | ICD-10-CM | POA: Diagnosis not present

## 2021-05-08 ENCOUNTER — Other Ambulatory Visit (HOSPITAL_COMMUNITY): Payer: Self-pay | Admitting: Internal Medicine

## 2021-06-25 ENCOUNTER — Other Ambulatory Visit: Payer: Self-pay | Admitting: Family Medicine

## 2021-06-25 ENCOUNTER — Ambulatory Visit
Admission: RE | Admit: 2021-06-25 | Discharge: 2021-06-25 | Disposition: A | Discharge status: Home / Self Care | Payer: PPO | Source: Ambulatory Visit | Attending: Family Medicine | Admitting: Family Medicine

## 2021-06-25 DIAGNOSIS — R921 Mammographic calcification found on diagnostic imaging of breast: Secondary | ICD-10-CM

## 2021-06-25 DIAGNOSIS — R922 Inconclusive mammogram: Secondary | ICD-10-CM | POA: Diagnosis not present

## 2021-07-12 ENCOUNTER — Other Ambulatory Visit (HOSPITAL_COMMUNITY): Payer: Self-pay

## 2021-07-12 MED ORDER — RIVAROXABAN 20 MG PO TABS: 20.0000 mg | ORAL_TABLET | Freq: Every day | ORAL | 0 refills | Status: DC

## 2021-08-01 ENCOUNTER — Other Ambulatory Visit: Payer: Self-pay | Admitting: Family Medicine

## 2021-08-01 DIAGNOSIS — M8580 Other specified disorders of bone density and structure, unspecified site: Secondary | ICD-10-CM

## 2021-08-16 DIAGNOSIS — H35373 Puckering of macula, bilateral: Secondary | ICD-10-CM | POA: Diagnosis not present

## 2021-08-16 DIAGNOSIS — H04123 Dry eye syndrome of bilateral lacrimal glands: Secondary | ICD-10-CM | POA: Diagnosis not present

## 2021-08-16 DIAGNOSIS — Z961 Presence of intraocular lens: Secondary | ICD-10-CM | POA: Diagnosis not present

## 2021-08-16 DIAGNOSIS — H40013 Open angle with borderline findings, low risk, bilateral: Secondary | ICD-10-CM | POA: Diagnosis not present

## 2021-09-09 DIAGNOSIS — R7303 Prediabetes: Secondary | ICD-10-CM | POA: Diagnosis not present

## 2021-09-09 DIAGNOSIS — I482 Chronic atrial fibrillation, unspecified: Secondary | ICD-10-CM | POA: Diagnosis not present

## 2021-09-09 DIAGNOSIS — E7800 Pure hypercholesterolemia, unspecified: Secondary | ICD-10-CM | POA: Diagnosis not present

## 2021-09-09 DIAGNOSIS — Z0000 Encounter for general adult medical examination without abnormal findings: Secondary | ICD-10-CM | POA: Diagnosis not present

## 2021-09-09 DIAGNOSIS — E559 Vitamin D deficiency, unspecified: Secondary | ICD-10-CM | POA: Diagnosis not present

## 2021-09-09 DIAGNOSIS — E039 Hypothyroidism, unspecified: Secondary | ICD-10-CM | POA: Diagnosis not present

## 2021-10-02 ENCOUNTER — Ambulatory Visit: Payer: PPO | Admitting: Internal Medicine

## 2021-10-02 ENCOUNTER — Encounter: Payer: Self-pay | Admitting: Internal Medicine

## 2021-10-02 VITALS — BP 124/82 | HR 84 | Ht 62.0 in | Wt 212.2 lb

## 2021-10-02 DIAGNOSIS — I4819 Other persistent atrial fibrillation: Secondary | ICD-10-CM | POA: Diagnosis not present

## 2021-10-02 NOTE — Patient Instructions (Signed)
Medication Instructions:  Your physician recommends that you continue on your current medications as directed. Please refer to the Current Medication list given to you today.  *If you need a refill on your cardiac medications before your next appointment, please call your pharmacy*   Lab Work:  Ionized Calcium today  If you have labs (blood work) drawn today and your tests are completely normal, you will receive your results only by: [facility] (if you have MyChart) OR A paper copy in the mail If you have any lab test that is abnormal or we need to change your treatment, we will call you to review the results.   Testing/Procedures: None ordered.    Follow-Up: At [facility], you and your health needs are our priority.  As part of our continuing mission to provide you with exceptional heart care, we have created designated Provider Care Teams.  These Care Teams include your primary Cardiologist (physician) and Advanced Practice Providers (APPs -  Physician Assistants and Nurse Practitioners) who all work together to provide you with the care you need, when you need it.  We recommend signing up for the patient portal called "MyChart".  Sign up information is provided on this After Visit Summary.  MyChart is used to connect with patients for Virtual Visits (Telemedicine).  Patients are able to view lab/test results, encounter notes, upcoming appointments, etc.  Non-urgent messages can be sent to your provider as well.   To learn more about what you can do with MyChart, go to [email or web address].    Your next appointment:   12 months with Dr Caryl Comes  Important Information About Sugar

## 2021-10-02 NOTE — Progress Notes (Signed)
Patient Care Team: Rankins, Bill Salinas, MD as PCP - General (Family Medicine)   HPI  Janice David is a 81 y.o. female seen in follow-up for atrial fibrillation-permanent.  Rates have been controlled.  It was elected to continue that strategy.  She is anticoagulated with Xarelto  ; No bleeding.  The patient denies chest pain, shortness of breath, nocturnal dyspnea, orthopnea  She does not walk much, but does like to work in the garden and digs for hours   There have been no palpitations, lightheadedness or syncope.  Complains of intermittent peripheral edema and elevated Ca on her most recent blood work.       DATE TEST EF   10/14 Echo  55-60 %   10/17 Echo   55-60 %   6/22 Echo  50-55% BAE   Thromboembolic risk profile is notable for age-18; gender-1;  her CHADS-VASc score >=3       Date Cr K Hgb  5/22  0.68 4.5   5/23 0.78 4.8 14.8      Past Medical History:  Diagnosis Date   Anemia    Asthma    Atrial fibrillation ([place])    Cataracta    Diverticulosis    GERD (gastroesophageal reflux disease)    HLD (hyperlipidemia)    Hypothyroidism    Internal hemorrhoids    Osteopenia    Vaginal polyp    Vitamin D deficiency     Past Surgical History:  Procedure Laterality Date   ABDOMINAL HYSTERECTOMY  05/28/1981   oavarian cyst and appendix removed   birth mark surgery  2/81, 1976   BREAST EXCISIONAL BIOPSY Left    2007 benign 2 areas   CARDIOVERSION N/A 03/25/2013   Procedure: CARDIOVERSION;  Surgeon: Darlin Coco, MD;  Location: [facility];  Service: Cardiovascular;  Laterality: N/A;   CATARACT EXTRACTION, BILATERAL     CESAREAN SECTION  02/07/1970   DILATION AND CURETTAGE OF UTERUS  03/20/1980   KNEE ARTHROSCOPY Left 01/23/2003   LAPAROSCOPY  1979   TONSILLECTOMY AND ADENOIDECTOMY     TUBAL LIGATION      Current Outpatient Medications  Medication Sig Dispense Refill   cholecalciferol (VITAMIN D) 1000 UNITS tablet Patient taking 4 caps (4000  units) daily by mouth.     levothyroxine (SYNTHROID, LEVOTHROID) 50 MCG tablet Take 50 mcg by mouth daily before breakfast.     metoprolol tartrate (LOPRESSOR) 25 MG tablet Take 1 tablet by mouth twice daily 180 tablet 1   rivaroxaban (XARELTO) 20 MG TABS tablet Take 1 tablet (20 mg total) by mouth daily with supper. Appointment Required For Further Refills [phone] 90 tablet 0   verapamil (CALAN-SR) 120 MG CR tablet TAKE 1 TABLET BY MOUTH ONCE DAILY AT BEDTIME 90 tablet 1   calcium carbonate (TUMS - DOSED IN MG ELEMENTAL CALCIUM) 500 MG chewable tablet Chew 1 tablet by mouth as needed for indigestion or heartburn. (Patient not taking: Reported on 10/02/2021)     furosemide (LASIX) 20 MG tablet Take 1 tablet (20 mg total) by mouth daily. (Patient not taking: Reported on 10/02/2021) 30 tablet 0   No current facility-administered medications for this visit.    Allergies  Allergen Reactions   Sulfa Antibiotics Other (See Comments) and Hives   Diltiazem Hcl     IV was OK, oral med caused nausea and possibly diarrhea.   Sulfonamide Derivatives     Unknown reaction, possible hives/sores    Review of Systems negative except  from HPI and PMH  Physical Exam BP 124/82 (BP Location: Left Arm, Patient Position: Sitting, Cuff Size: Large)   Pulse 84   Ht '5\' 2"'$  (1.575 m)   Wt 212 lb 3.2 oz (96.3 kg)   SpO2 97%   BMI 38.81 kg/m  Well developed and nourished in no acute distress HENT normal Neck supple with JVP-flat Carotids brisk and full without bruits Clear Irregularly irregular rate and rhythm with controlled ventricular response, no murmurs or gallops Abd-soft with active BS without hepatomegaly No Clubbing cyanosis edema Skin-warm and dry A & Oriented  Grossly normal sensory and motor function  ECG: AF  '@86'$             Intervals  -/08-/37  Axis  5     Assessment and  Plan  Atrial fibrillation-permanent with a controlled ventricular response  HFpEF  acute/chronic  Hypertension  Hypercalcemia  Atrial fibrillation is permanent.  Seems adequate rate control.  Tolerating Xarelto without bleeding GFR appropriate for current dosing  Blood pressure well controlled.  We will continue on verapamil 120 daily and metoprolol 25 twice daily.  She was previously poorly tolerating of her furosemide; we decided that if she had significant edema she would take her furosemide for just 2 days at a time instead of 5 days in a row which was associated with some aching last time.  Her labs were reviewed from [place].  Her new physician is retiring; I took the liberty of ordering a ionized calcium to clarify her modestly elevated total calcium level.

## 2021-10-03 LAB — CALCIUM, IONIZED: Calcium, Ion: 5.6 mg/dL (ref 4.5–5.6)

## 2021-10-07 ENCOUNTER — Other Ambulatory Visit: Payer: Self-pay

## 2021-10-07 MED ORDER — RIVAROXABAN 20 MG PO TABS: 20.0000 mg | ORAL_TABLET | Freq: Every day | ORAL | 1 refills | Status: DC

## 2021-10-07 NOTE — Telephone Encounter (Signed)
Prescription refill request for Xarelto received.  Indication: Atrial Fib Last office visit: 10/02/21  Olin Pia MD Weight: 96.3kg Age: 81 Scr: 0.78 on 09/09/21 CrCl: 88.91  Based on above findings Xarelto '20mg'$  daily is the appropriate dose.  Refill approved.

## 2021-10-24 ENCOUNTER — Telehealth: Payer: Self-pay | Admitting: Internal Medicine

## 2021-10-24 NOTE — Telephone Encounter (Signed)
Patient requested lab results from 5/31. Will forward to Dr. Caryl Comes or review.

## 2021-10-24 NOTE — Telephone Encounter (Signed)
Follow Up:  Patient would like her lab results from 10-02-21?

## 2021-11-04 ENCOUNTER — Telehealth: Payer: Self-pay

## 2021-11-04 NOTE — Telephone Encounter (Signed)
Spoke with pt and advised of normal lab.  Pt verbalizes understanding and thanked Therapist, sports for the phone call.

## 2021-11-04 NOTE — Telephone Encounter (Signed)
-----   Message from Deboraha Sprang, MD sent at 10/30/2021  9:37 PM EDT ----- Please Inform Patient that labs are normal  Thanks

## 2021-11-09 ENCOUNTER — Other Ambulatory Visit (HOSPITAL_COMMUNITY): Payer: Self-pay | Admitting: Internal Medicine

## 2021-12-04 ENCOUNTER — Ambulatory Visit
Admission: RE | Admit: 2021-12-04 | Discharge: 2021-12-04 | Disposition: A | Discharge status: Home / Self Care | Payer: PPO | Source: Ambulatory Visit | Attending: Family Medicine | Admitting: Family Medicine

## 2021-12-04 DIAGNOSIS — R921 Mammographic calcification found on diagnostic imaging of breast: Secondary | ICD-10-CM

## 2022-01-23 ENCOUNTER — Ambulatory Visit
Admission: RE | Admit: 2022-01-23 | Discharge: 2022-01-23 | Disposition: A | Discharge status: Home / Self Care | Payer: PPO | Source: Ambulatory Visit | Attending: Family Medicine | Admitting: Family Medicine

## 2022-01-23 DIAGNOSIS — M8580 Other specified disorders of bone density and structure, unspecified site: Secondary | ICD-10-CM

## 2022-01-23 DIAGNOSIS — M8589 Other specified disorders of bone density and structure, multiple sites: Secondary | ICD-10-CM | POA: Diagnosis not present

## 2022-01-23 DIAGNOSIS — Z780 Asymptomatic menopausal state: Secondary | ICD-10-CM | POA: Diagnosis not present

## 2022-01-28 DIAGNOSIS — L821 Other seborrheic keratosis: Secondary | ICD-10-CM | POA: Diagnosis not present

## 2022-01-28 DIAGNOSIS — L820 Inflamed seborrheic keratosis: Secondary | ICD-10-CM | POA: Diagnosis not present

## 2022-01-28 DIAGNOSIS — D225 Melanocytic nevi of trunk: Secondary | ICD-10-CM | POA: Diagnosis not present

## 2022-01-28 DIAGNOSIS — L718 Other rosacea: Secondary | ICD-10-CM | POA: Diagnosis not present

## 2022-01-28 DIAGNOSIS — D1801 Hemangioma of skin and subcutaneous tissue: Secondary | ICD-10-CM | POA: Diagnosis not present

## 2022-01-28 DIAGNOSIS — D224 Melanocytic nevi of scalp and neck: Secondary | ICD-10-CM | POA: Diagnosis not present

## 2022-01-28 DIAGNOSIS — Z85828 Personal history of other malignant neoplasm of skin: Secondary | ICD-10-CM | POA: Diagnosis not present

## 2022-01-28 DIAGNOSIS — L570 Actinic keratosis: Secondary | ICD-10-CM | POA: Diagnosis not present

## 2022-03-04 DIAGNOSIS — H04123 Dry eye syndrome of bilateral lacrimal glands: Secondary | ICD-10-CM | POA: Diagnosis not present

## 2022-03-04 DIAGNOSIS — H18413 Arcus senilis, bilateral: Secondary | ICD-10-CM | POA: Diagnosis not present

## 2022-03-04 DIAGNOSIS — H35373 Puckering of macula, bilateral: Secondary | ICD-10-CM | POA: Diagnosis not present

## 2022-03-04 DIAGNOSIS — H40013 Open angle with borderline findings, low risk, bilateral: Secondary | ICD-10-CM | POA: Diagnosis not present

## 2022-04-07 ENCOUNTER — Other Ambulatory Visit: Payer: Self-pay

## 2022-04-07 DIAGNOSIS — I4891 Unspecified atrial fibrillation: Secondary | ICD-10-CM

## 2022-04-07 MED ORDER — RIVAROXABAN 20 MG PO TABS: 20.0000 mg | ORAL_TABLET | Freq: Every day | ORAL | 1 refills | Status: DC

## 2022-04-07 NOTE — Telephone Encounter (Signed)
Prescription refill request for Xarelto received.  Indication:Afib  Last office visit: 10/02/21 Caryl Comes)  Weight: 96.3kg Age: 81 Scr: 0.78 (09/09/21 via [facility])  CrCl: 87.73m/min  Appropriate dose and refill sent to requested pharmacy.

## 2022-05-30 ENCOUNTER — Telehealth: Payer: Self-pay | Admitting: Internal Medicine

## 2022-05-30 ENCOUNTER — Other Ambulatory Visit: Payer: Self-pay

## 2022-05-30 ENCOUNTER — Other Ambulatory Visit (HOSPITAL_COMMUNITY): Payer: Self-pay

## 2022-05-30 MED ORDER — METOPROLOL TARTRATE 25 MG PO TABS
25.0000 mg | ORAL_TABLET | Freq: Two times a day (BID) | ORAL | 1 refills | Status: DC
  Filled 2022-05-30 – 2022-06-02 (×3): qty 180, 90d supply, fill #0

## 2022-05-30 NOTE — Telephone Encounter (Signed)
*  STAT* If patient is at the pharmacy, call can be transferred to refill team.   1. Which medications need to be refilled? (please list name of each medication and dose if known)   metoprolol tartrate (LOPRESSOR) 25 MG tablet    2. Which pharmacy/location (including street and city if local pharmacy) is medication to be sent to?  [place]   3. Do they need a 30 day or 90 day supply? 90 day - with 100 tablets per pt's insurance

## 2022-06-02 ENCOUNTER — Other Ambulatory Visit (HOSPITAL_COMMUNITY): Payer: Self-pay

## 2022-06-02 ENCOUNTER — Other Ambulatory Visit: Payer: Self-pay

## 2022-06-12 ENCOUNTER — Other Ambulatory Visit: Payer: Self-pay

## 2022-06-12 ENCOUNTER — Other Ambulatory Visit (HOSPITAL_COMMUNITY): Payer: Self-pay

## 2022-06-12 ENCOUNTER — Encounter (HOSPITAL_COMMUNITY): Payer: Self-pay | Admitting: *Deleted

## 2022-06-12 MED ORDER — LEVOTHYROXINE SODIUM 50 MCG PO TABS
50.0000 ug | ORAL_TABLET | Freq: Every day | ORAL | 0 refills | Status: DC
  Filled 2022-06-12: qty 100, 100d supply, fill #0

## 2022-07-09 ENCOUNTER — Other Ambulatory Visit: Payer: Self-pay | Admitting: Family Medicine

## 2022-07-09 DIAGNOSIS — Z1231 Encounter for screening mammogram for malignant neoplasm of breast: Secondary | ICD-10-CM

## 2022-07-28 ENCOUNTER — Other Ambulatory Visit (HOSPITAL_COMMUNITY): Payer: Self-pay

## 2022-07-28 ENCOUNTER — Telehealth: Payer: Self-pay | Admitting: *Deleted

## 2022-07-28 DIAGNOSIS — I4891 Unspecified atrial fibrillation: Secondary | ICD-10-CM

## 2022-07-28 MED ORDER — RIVAROXABAN 20 MG PO TABS
20.0000 mg | ORAL_TABLET | Freq: Every day | ORAL | 1 refills | Status: DC
  Filled 2022-07-28: qty 90, 90d supply, fill #0
  Filled 2022-07-30 (×3): qty 100, 100d supply, fill #0
  Filled 2022-11-10 – 2022-11-12 (×2): qty 100, 100d supply, fill #1
  Filled 2022-11-13 – 2022-11-14 (×2): qty 30, 30d supply, fill #1

## 2022-07-28 NOTE — Telephone Encounter (Signed)
Pt CrCL 87 ml/min. 20mg  once daily is correct dosage. However since medication is once daily, 100 tablets would be a 100 day supply. If she wants 100 tablets that is fine.

## 2022-07-28 NOTE — Telephone Encounter (Signed)
Pt called into refill room requesting refill of her Xarelto and Verapamil to be sent to her new pharmacy, [facility].   Pt is requesting 100 tablets per 90 days.  I had explained to pt that we are continuing the 90 days supply (this is what I was told by [facility] in refills) and pt is requesting to speak to Dr. Caryl Comes to see if she can have her quantity changed on Xarelto and Verapamil.  Pt aware I will send his RN, Rosann Auerbach, a message.  I am also forwarding to Pharmacist as well since Xarelto is included.

## 2022-07-28 NOTE — Telephone Encounter (Signed)
It has been sent!

## 2022-07-29 ENCOUNTER — Other Ambulatory Visit (HOSPITAL_COMMUNITY): Payer: Self-pay

## 2022-07-29 MED ORDER — VERAPAMIL HCL ER 120 MG PO TBCR
120.0000 mg | EXTENDED_RELEASE_TABLET | Freq: Every day | ORAL | 0 refills | Status: DC
  Filled 2022-07-29: qty 100, 100d supply, fill #0

## 2022-07-29 NOTE — Telephone Encounter (Signed)
Pt aware that we have sent in her refill for Xarelto for a quantity of 100.

## 2022-07-29 NOTE — Telephone Encounter (Signed)
Verapamil 120mg  CR - 1 tablet by mouth at bedtime - #100 with 0 RF sent to [facility] as requested by pt.  Pt is scheduled for 12 month f/u with Dr Caryl Comes on 10/16/2022.

## 2022-07-30 ENCOUNTER — Other Ambulatory Visit (HOSPITAL_COMMUNITY): Payer: Self-pay

## 2022-07-30 ENCOUNTER — Other Ambulatory Visit: Payer: Self-pay

## 2022-08-25 ENCOUNTER — Other Ambulatory Visit: Payer: Self-pay

## 2022-08-25 ENCOUNTER — Other Ambulatory Visit (HOSPITAL_COMMUNITY): Payer: Self-pay

## 2022-08-25 MED ORDER — METOPROLOL TARTRATE 25 MG PO TABS
25.0000 mg | ORAL_TABLET | Freq: Two times a day (BID) | ORAL | 0 refills | Status: DC
  Filled 2022-08-25: qty 200, 100d supply, fill #0

## 2022-08-25 NOTE — Telephone Encounter (Signed)
Pt's medication was sent to pt's pharmacy as requested. Confirmation received.  °

## 2022-09-11 DIAGNOSIS — H40013 Open angle with borderline findings, low risk, bilateral: Secondary | ICD-10-CM | POA: Diagnosis not present

## 2022-09-11 DIAGNOSIS — Z961 Presence of intraocular lens: Secondary | ICD-10-CM | POA: Diagnosis not present

## 2022-09-11 DIAGNOSIS — H04123 Dry eye syndrome of bilateral lacrimal glands: Secondary | ICD-10-CM | POA: Diagnosis not present

## 2022-09-11 DIAGNOSIS — H35373 Puckering of macula, bilateral: Secondary | ICD-10-CM | POA: Diagnosis not present

## 2022-09-12 DIAGNOSIS — Z6838 Body mass index (BMI) 38.0-38.9, adult: Secondary | ICD-10-CM | POA: Diagnosis not present

## 2022-09-12 DIAGNOSIS — E871 Hypo-osmolality and hyponatremia: Secondary | ICD-10-CM | POA: Diagnosis not present

## 2022-09-12 DIAGNOSIS — M8588 Other specified disorders of bone density and structure, other site: Secondary | ICD-10-CM | POA: Diagnosis not present

## 2022-09-12 DIAGNOSIS — E7800 Pure hypercholesterolemia, unspecified: Secondary | ICD-10-CM | POA: Diagnosis not present

## 2022-09-12 DIAGNOSIS — Z0000 Encounter for general adult medical examination without abnormal findings: Secondary | ICD-10-CM | POA: Diagnosis not present

## 2022-09-12 DIAGNOSIS — I482 Chronic atrial fibrillation, unspecified: Secondary | ICD-10-CM | POA: Diagnosis not present

## 2022-09-12 DIAGNOSIS — E039 Hypothyroidism, unspecified: Secondary | ICD-10-CM | POA: Diagnosis not present

## 2022-09-12 DIAGNOSIS — D6869 Other thrombophilia: Secondary | ICD-10-CM | POA: Diagnosis not present

## 2022-09-12 DIAGNOSIS — R7303 Prediabetes: Secondary | ICD-10-CM | POA: Diagnosis not present

## 2022-10-06 ENCOUNTER — Other Ambulatory Visit (HOSPITAL_COMMUNITY): Payer: Self-pay

## 2022-10-07 ENCOUNTER — Other Ambulatory Visit: Payer: Self-pay

## 2022-10-07 ENCOUNTER — Other Ambulatory Visit (HOSPITAL_COMMUNITY): Payer: Self-pay

## 2022-10-07 MED ORDER — LEVOTHYROXINE SODIUM 50 MCG PO TABS
50.0000 ug | ORAL_TABLET | Freq: Every day | ORAL | 3 refills | Status: DC
  Filled 2022-10-07 (×2): qty 100, 100d supply, fill #0
  Filled 2023-01-09: qty 100, 100d supply, fill #1
  Filled 2023-04-13: qty 100, 100d supply, fill #2
  Filled 2023-07-29: qty 100, 100d supply, fill #3

## 2022-10-16 ENCOUNTER — Encounter: Payer: Self-pay | Admitting: Internal Medicine

## 2022-10-16 ENCOUNTER — Ambulatory Visit: Payer: PPO | Attending: Internal Medicine | Admitting: Internal Medicine

## 2022-10-16 VITALS — BP 172/112 | HR 97 | Ht 62.0 in | Wt 207.0 lb

## 2022-10-16 DIAGNOSIS — I5033 Acute on chronic diastolic (congestive) heart failure: Secondary | ICD-10-CM | POA: Diagnosis not present

## 2022-10-16 DIAGNOSIS — I4821 Permanent atrial fibrillation: Secondary | ICD-10-CM | POA: Insufficient documentation

## 2022-10-16 DIAGNOSIS — I5030 Unspecified diastolic (congestive) heart failure: Secondary | ICD-10-CM | POA: Insufficient documentation

## 2022-10-16 NOTE — Progress Notes (Signed)
Patient Care Team: Rankins, Fanny Dance, MD as PCP - General (Family Medicine)   HPI  Janice David is a 82 y.o. female seen in follow-up for atrial fibrillation-permanent.  Rates have been controlled.  It was elected to continue that strategy.  She is anticoagulated with Xarelto   Increasing fatigue and "sluggish "did not tolerate higher doses of beta-blocker.  Does not check her blood pressure at home.  Some nuisance bleeding with her Xarelto.  Labs followed by her PCP and hemoglobin 5/24 was normal  .       DATE TEST EF   10/14 Echo  55-60 %   10/17 Echo   55-60 %   6/22 Echo  50-55% BAE   Thromboembolic risk profile is notable for age-81; gender-1;  her CHADS-VASc score >=3       Date Cr K Hgb  5/22  0.68 4.5   5/23 0.78 4.8 14.8  5/24   14.+      Past Medical History:  Diagnosis Date   Anemia    Asthma    Atrial fibrillation (HCC)    Cataracta    Diverticulosis    GERD (gastroesophageal reflux disease)    HLD (hyperlipidemia)    Hypothyroidism    Internal hemorrhoids    Osteopenia    Vaginal polyp    Vitamin D deficiency     Past Surgical History:  Procedure Laterality Date   ABDOMINAL HYSTERECTOMY  05/28/1981   oavarian cyst and appendix removed   birth mark surgery  2/81, 1976   BREAST EXCISIONAL BIOPSY Left    2007 benign 2 areas   CARDIOVERSION N/A 03/25/2013   Procedure: CARDIOVERSION;  Surgeon: Cassell Clement, MD;  Location: [facility] ENDOSCOPY;  Service: Cardiovascular;  Laterality: N/A;   CATARACT EXTRACTION, BILATERAL     CESAREAN SECTION  02/07/1970   DILATION AND CURETTAGE OF UTERUS  03/20/1980   KNEE ARTHROSCOPY Left 01/23/2003   LAPAROSCOPY  1979   TONSILLECTOMY AND ADENOIDECTOMY     TUBAL LIGATION      Current Outpatient Medications  Medication Sig Dispense Refill   calcium carbonate (TUMS - DOSED IN MG ELEMENTAL CALCIUM) 500 MG chewable tablet Chew 1 tablet by mouth as needed for indigestion or heartburn.     cholecalciferol  (VITAMIN D) 1000 UNITS tablet Patient taking 4 caps (4000 units) daily by mouth.     levothyroxine (SYNTHROID) 50 MCG tablet Take 1 tablet (50 mcg total) by mouth daily. 100 tablet 3   levothyroxine (SYNTHROID, LEVOTHROID) 50 MCG tablet Take 50 mcg by mouth daily before breakfast.     metoprolol tartrate (LOPRESSOR) 25 MG tablet Take 1 tablet (25 mg total) by mouth 2 (two) times daily. 200 tablet 0   rivaroxaban (XARELTO) 20 MG TABS tablet Take 1 tablet (20 mg total) by mouth daily with supper. 100 tablet 1   verapamil (CALAN-SR) 120 MG CR tablet Take 1 tablet (120 mg total) by mouth at bedtime. 100 tablet 0   furosemide (LASIX) 20 MG tablet Take 1 tablet (20 mg total) by mouth daily. (Patient not taking: Reported on 10/16/2022) 30 tablet 0   No current facility-administered medications for this visit.    Allergies  Allergen Reactions   Sulfa Antibiotics Other (See Comments) and Hives   Diltiazem Hcl     IV was OK, oral med caused nausea and possibly diarrhea.   Sulfonamide Derivatives     Unknown reaction, possible hives/sores    Review of Systems negative  except from HPI and PMH  Physical Exam BP (!) 152/102   Pulse 97   Ht 5\' 2"  (1.575 m)   Wt 207 lb (93.9 kg)   SpO2 98%   BMI 37.86 kg/m  Well developed and nourished in no acute distress HENT normal Neck supple  Clear Rapid and irregular and rhythm, no murmurs or gallops Abd-soft with active BS No Clubbing cyanosis tr edema Skin-warm and dry A & Oriented  Grossly normal sensory and motor function  ECG atrial fibrillation at 97 Intervals-/08/33   Assessment and  Plan  Atrial fibrillation-permanent with a controlled ventricular response  HFpEF acute/chronic  Hypertension  Hypercalcemia     Permanent atrial fibrillation.  Rates are on the rapid side.  Blood pressure is significantly elevated.  We discussed options including beta-blockers to which she is averse, doubling her verapamil which she would like to  consider, or a 2 pronged strategy where we would use digoxin in conjunction with the verapamil for rate control and an alternative antihypertensive agent.  She would like to think about these things.  She has questions about her hypercalcemia, ionized calciums have been normal in the past.  She is euvolemic.  Encouraged weight loss

## 2022-10-16 NOTE — Patient Instructions (Signed)
Medication Instructions:  Your physician recommends that you continue on your current medications as directed. Please refer to the Current Medication list given to you today.  *If you need a refill on your cardiac medications before your next appointment, please call your pharmacy*   Lab Work: None ordered.  If you have labs (blood work) drawn today and your tests are completely normal, you will receive your results only by: MyChart Message (if you have MyChart) OR A paper copy in the mail If you have any lab test that is abnormal or we need to change your treatment, we will call you to review the results.   Testing/Procedures: None ordered.    Follow-Up: At [facility], you and your health needs are our priority.  As part of our continuing mission to provide you with exceptional heart care, we have created designated Provider Care Teams.  These Care Teams include your primary Cardiologist (physician) and Advanced Practice Providers (APPs -  Physician Assistants and Nurse Practitioners) who all work together to provide you with the care you need, when you need it.  We recommend signing up for the patient portal called "MyChart".  Sign up information is provided on this After Visit Summary.  MyChart is used to connect with patients for Virtual Visits (Telemedicine).  Patients are able to view lab/test results, encounter notes, upcoming appointments, etc.  Non-urgent messages can be sent to your provider as well.   To learn more about what you can do with MyChart, go to [email or web address].    Your next appointment:   12 months with Dr Klein 

## 2022-11-10 ENCOUNTER — Other Ambulatory Visit: Payer: Self-pay | Admitting: Internal Medicine

## 2022-11-10 ENCOUNTER — Other Ambulatory Visit (HOSPITAL_COMMUNITY): Payer: Self-pay

## 2022-11-10 DIAGNOSIS — I4821 Permanent atrial fibrillation: Secondary | ICD-10-CM

## 2022-11-10 MED ORDER — VERAPAMIL HCL ER 120 MG PO TBCR
120.0000 mg | EXTENDED_RELEASE_TABLET | Freq: Every day | ORAL | 1 refills | Status: DC
  Filled 2022-11-10: qty 100, 100d supply, fill #0
  Filled 2023-02-19: qty 100, 100d supply, fill #1

## 2022-11-11 ENCOUNTER — Other Ambulatory Visit (HOSPITAL_COMMUNITY): Payer: Self-pay

## 2022-11-12 ENCOUNTER — Other Ambulatory Visit (HOSPITAL_COMMUNITY): Payer: Self-pay

## 2022-11-12 DIAGNOSIS — E559 Vitamin D deficiency, unspecified: Secondary | ICD-10-CM | POA: Diagnosis not present

## 2022-11-13 ENCOUNTER — Other Ambulatory Visit (HOSPITAL_COMMUNITY): Payer: Self-pay

## 2022-11-14 ENCOUNTER — Other Ambulatory Visit: Payer: Self-pay

## 2022-11-14 ENCOUNTER — Other Ambulatory Visit (HOSPITAL_COMMUNITY): Payer: Self-pay

## 2022-11-25 ENCOUNTER — Other Ambulatory Visit (HOSPITAL_COMMUNITY): Payer: Self-pay

## 2022-11-25 ENCOUNTER — Other Ambulatory Visit: Payer: Self-pay | Admitting: Internal Medicine

## 2022-11-25 MED ORDER — METOPROLOL TARTRATE 25 MG PO TABS
25.0000 mg | ORAL_TABLET | Freq: Two times a day (BID) | ORAL | 3 refills | Status: DC
  Filled 2022-11-25: qty 180, 90d supply, fill #0
  Filled 2023-03-16: qty 180, 90d supply, fill #1
  Filled 2023-06-18: qty 180, 90d supply, fill #2
  Filled 2023-09-20: qty 180, 90d supply, fill #3

## 2022-12-08 ENCOUNTER — Ambulatory Visit
Admission: RE | Admit: 2022-12-08 | Discharge: 2022-12-08 | Disposition: A | Discharge status: Home / Self Care | Payer: PPO | Source: Ambulatory Visit | Attending: Family Medicine | Admitting: Family Medicine

## 2022-12-08 ENCOUNTER — Other Ambulatory Visit: Payer: Self-pay | Admitting: Family Medicine

## 2022-12-08 DIAGNOSIS — R921 Mammographic calcification found on diagnostic imaging of breast: Secondary | ICD-10-CM

## 2022-12-08 DIAGNOSIS — Z1231 Encounter for screening mammogram for malignant neoplasm of breast: Secondary | ICD-10-CM

## 2022-12-15 ENCOUNTER — Other Ambulatory Visit: Payer: Self-pay

## 2022-12-15 ENCOUNTER — Other Ambulatory Visit: Payer: Self-pay | Admitting: Internal Medicine

## 2022-12-15 ENCOUNTER — Other Ambulatory Visit (HOSPITAL_COMMUNITY): Payer: Self-pay

## 2022-12-15 ENCOUNTER — Telehealth: Payer: Self-pay | Admitting: Internal Medicine

## 2022-12-15 DIAGNOSIS — I4891 Unspecified atrial fibrillation: Secondary | ICD-10-CM

## 2022-12-15 MED ORDER — RIVAROXABAN 20 MG PO TABS
20.0000 mg | ORAL_TABLET | Freq: Every day | ORAL | 0 refills | Status: DC
Start: 2022-12-15 — End: 2023-03-26
  Filled 2022-12-15: qty 100, 100d supply, fill #0

## 2022-12-15 NOTE — Telephone Encounter (Signed)
Prescription refill request for Xarelto received.  Indication:Afib  Last office visit:  10/16/22 Graciela Husbands)  Weight: 93.9kg Age: 82 Scr: 0.73 (09/12/22 via [facility])  CrCl: 89.8ml/min  Appropriate dose. Refill sent.

## 2022-12-15 NOTE — Telephone Encounter (Signed)
*  STAT* If patient is at the pharmacy, call can be transferred to refill team.   1. Which medications need to be refilled? (please list name of each medication and dose if known) rivaroxaban (XARELTO) 20 MG TABS tablet   2. Which pharmacy/location (including street and city if local pharmacy) is medication to be sent to?  [facility]    3. Do they need a 30 day or 90 day supply? 100

## 2022-12-18 ENCOUNTER — Ambulatory Visit
Admission: RE | Admit: 2022-12-18 | Discharge: 2022-12-18 | Disposition: A | Discharge status: Home / Self Care | Payer: PPO | Source: Ambulatory Visit | Attending: Family Medicine | Admitting: Family Medicine

## 2022-12-18 DIAGNOSIS — R921 Mammographic calcification found on diagnostic imaging of breast: Secondary | ICD-10-CM

## 2023-01-09 ENCOUNTER — Other Ambulatory Visit (HOSPITAL_COMMUNITY): Payer: Self-pay

## 2023-01-21 DIAGNOSIS — D485 Neoplasm of uncertain behavior of skin: Secondary | ICD-10-CM | POA: Diagnosis not present

## 2023-01-21 DIAGNOSIS — C4442 Squamous cell carcinoma of skin of scalp and neck: Secondary | ICD-10-CM | POA: Diagnosis not present

## 2023-01-21 DIAGNOSIS — Z85828 Personal history of other malignant neoplasm of skin: Secondary | ICD-10-CM | POA: Diagnosis not present

## 2023-01-21 DIAGNOSIS — D225 Melanocytic nevi of trunk: Secondary | ICD-10-CM | POA: Diagnosis not present

## 2023-01-21 DIAGNOSIS — L570 Actinic keratosis: Secondary | ICD-10-CM | POA: Diagnosis not present

## 2023-01-21 DIAGNOSIS — L814 Other melanin hyperpigmentation: Secondary | ICD-10-CM | POA: Diagnosis not present

## 2023-01-21 DIAGNOSIS — D1801 Hemangioma of skin and subcutaneous tissue: Secondary | ICD-10-CM | POA: Diagnosis not present

## 2023-01-21 DIAGNOSIS — L821 Other seborrheic keratosis: Secondary | ICD-10-CM | POA: Diagnosis not present

## 2023-01-21 DIAGNOSIS — D045 Carcinoma in situ of skin of trunk: Secondary | ICD-10-CM | POA: Diagnosis not present

## 2023-01-21 DIAGNOSIS — L812 Freckles: Secondary | ICD-10-CM | POA: Diagnosis not present

## 2023-02-19 ENCOUNTER — Other Ambulatory Visit: Payer: Self-pay

## 2023-03-11 DIAGNOSIS — E871 Hypo-osmolality and hyponatremia: Secondary | ICD-10-CM | POA: Diagnosis not present

## 2023-03-11 DIAGNOSIS — E039 Hypothyroidism, unspecified: Secondary | ICD-10-CM | POA: Diagnosis not present

## 2023-03-11 DIAGNOSIS — Z6837 Body mass index (BMI) 37.0-37.9, adult: Secondary | ICD-10-CM | POA: Diagnosis not present

## 2023-03-11 DIAGNOSIS — I10 Essential (primary) hypertension: Secondary | ICD-10-CM | POA: Diagnosis not present

## 2023-03-11 DIAGNOSIS — I4891 Unspecified atrial fibrillation: Secondary | ICD-10-CM | POA: Diagnosis not present

## 2023-03-16 ENCOUNTER — Other Ambulatory Visit (HOSPITAL_COMMUNITY): Payer: Self-pay

## 2023-03-19 DIAGNOSIS — H35373 Puckering of macula, bilateral: Secondary | ICD-10-CM | POA: Diagnosis not present

## 2023-03-19 DIAGNOSIS — H04123 Dry eye syndrome of bilateral lacrimal glands: Secondary | ICD-10-CM | POA: Diagnosis not present

## 2023-03-19 DIAGNOSIS — Z961 Presence of intraocular lens: Secondary | ICD-10-CM | POA: Diagnosis not present

## 2023-03-19 DIAGNOSIS — H40013 Open angle with borderline findings, low risk, bilateral: Secondary | ICD-10-CM | POA: Diagnosis not present

## 2023-03-26 ENCOUNTER — Other Ambulatory Visit: Payer: Self-pay

## 2023-03-26 ENCOUNTER — Other Ambulatory Visit: Payer: Self-pay | Admitting: Internal Medicine

## 2023-03-26 ENCOUNTER — Other Ambulatory Visit (HOSPITAL_COMMUNITY): Payer: Self-pay

## 2023-03-26 DIAGNOSIS — I4891 Unspecified atrial fibrillation: Secondary | ICD-10-CM

## 2023-03-26 MED ORDER — RIVAROXABAN 20 MG PO TABS
20.0000 mg | ORAL_TABLET | Freq: Every day | ORAL | 1 refills | Status: DC
Start: 2023-03-26 — End: 2023-08-24
  Filled 2023-03-26: qty 100, 100d supply, fill #0
  Filled 2023-03-27: qty 35, 35d supply, fill #0
  Filled 2023-05-12: qty 100, 100d supply, fill #1
  Filled 2023-08-24: qty 65, 65d supply, fill #2

## 2023-03-26 NOTE — Telephone Encounter (Signed)
Xarelto 20mg  refill request received. Pt is 82 years old, weight-93.9kg, Crea-0.61 on 03/11/23 via [facility] from [place], last seen by Dr. Graciela Husbands on 10/16/22, Diagnosis-Afib, CrCl- 99.1 mL/min; Dose is appropriate based on dosing criteria. Will send in refill to requested pharmacy.

## 2023-03-27 ENCOUNTER — Other Ambulatory Visit (HOSPITAL_COMMUNITY): Payer: Self-pay

## 2023-03-27 ENCOUNTER — Telehealth: Payer: Self-pay | Admitting: Internal Medicine

## 2023-03-27 DIAGNOSIS — I4891 Unspecified atrial fibrillation: Secondary | ICD-10-CM

## 2023-03-27 NOTE — Telephone Encounter (Signed)
Pt last saw Dr Graciela Husbands 10/16/22, last labs 03/11/23 Creat 0.66 at [facility] per care everywhere, age 82, weight 93.9kg, CrCl 99.1, based on CrCl pt is on appropriate dosage of Xarelto.  Pt is requesting sameples, ok to provide if available.  Thanks

## 2023-03-27 NOTE — Telephone Encounter (Signed)
Patient calling the office for samples of medication:   1.  What medication and dosage are you requesting samples for? rivaroxaban (XARELTO) 20 MG TABS tablet  2.  Are you currently out of this medication?  No.

## 2023-03-27 NOTE — Telephone Encounter (Signed)
Spoke to Pt and leaving 2 wks worth of Xeralto up front with Patient Assistance Paperwork

## 2023-04-13 ENCOUNTER — Other Ambulatory Visit (HOSPITAL_COMMUNITY): Payer: Self-pay

## 2023-05-12 ENCOUNTER — Other Ambulatory Visit (HOSPITAL_COMMUNITY): Payer: Self-pay

## 2023-05-12 ENCOUNTER — Other Ambulatory Visit: Payer: Self-pay | Admitting: Internal Medicine

## 2023-05-12 ENCOUNTER — Other Ambulatory Visit: Payer: Self-pay

## 2023-05-12 DIAGNOSIS — I4821 Permanent atrial fibrillation: Secondary | ICD-10-CM

## 2023-05-12 MED ORDER — VERAPAMIL HCL ER 120 MG PO TBCR
120.0000 mg | EXTENDED_RELEASE_TABLET | Freq: Every day | ORAL | 2 refills | Status: DC
Start: 2023-05-12 — End: 2024-02-24
  Filled 2023-05-12: qty 90, 90d supply, fill #0
  Filled 2023-08-24: qty 90, 90d supply, fill #1
  Filled 2023-12-02: qty 90, 90d supply, fill #2

## 2023-05-13 ENCOUNTER — Other Ambulatory Visit: Payer: Self-pay

## 2023-06-18 ENCOUNTER — Other Ambulatory Visit (HOSPITAL_COMMUNITY): Payer: Self-pay

## 2023-07-10 ENCOUNTER — Other Ambulatory Visit: Payer: Self-pay | Admitting: Family Medicine

## 2023-07-10 DIAGNOSIS — Z1231 Encounter for screening mammogram for malignant neoplasm of breast: Secondary | ICD-10-CM

## 2023-07-29 ENCOUNTER — Other Ambulatory Visit (HOSPITAL_COMMUNITY): Payer: Self-pay

## 2023-08-24 ENCOUNTER — Other Ambulatory Visit: Payer: Self-pay | Admitting: Internal Medicine

## 2023-08-24 ENCOUNTER — Other Ambulatory Visit: Payer: Self-pay

## 2023-08-24 ENCOUNTER — Other Ambulatory Visit (HOSPITAL_COMMUNITY): Payer: Self-pay

## 2023-08-24 DIAGNOSIS — I4891 Unspecified atrial fibrillation: Secondary | ICD-10-CM

## 2023-08-24 MED ORDER — RIVAROXABAN 20 MG PO TABS
20.0000 mg | ORAL_TABLET | Freq: Every day | ORAL | 1 refills | Status: DC
Start: 2023-08-24 — End: 2024-02-24
  Filled 2023-08-24: qty 100, 100d supply, fill #0
  Filled 2023-12-02: qty 100, 100d supply, fill #1

## 2023-08-24 NOTE — Telephone Encounter (Addendum)
 Prescription refill request for Xarelto  received.  Indication:afib Last office visit:6/24 Weight:93.9  kg Age:83 Scr:0.66  11/24 CrCl:99.1  ml/min  Prescription refilled

## 2023-08-25 ENCOUNTER — Other Ambulatory Visit: Payer: Self-pay

## 2023-08-25 ENCOUNTER — Other Ambulatory Visit (HOSPITAL_COMMUNITY): Payer: Self-pay

## 2023-09-20 ENCOUNTER — Other Ambulatory Visit (HOSPITAL_COMMUNITY): Payer: Self-pay

## 2023-09-24 DIAGNOSIS — Z0000 Encounter for general adult medical examination without abnormal findings: Secondary | ICD-10-CM | POA: Diagnosis not present

## 2023-09-24 DIAGNOSIS — I10 Essential (primary) hypertension: Secondary | ICD-10-CM | POA: Diagnosis not present

## 2023-09-24 DIAGNOSIS — Z1331 Encounter for screening for depression: Secondary | ICD-10-CM | POA: Diagnosis not present

## 2023-09-24 DIAGNOSIS — E039 Hypothyroidism, unspecified: Secondary | ICD-10-CM | POA: Diagnosis not present

## 2023-09-24 DIAGNOSIS — Z6838 Body mass index (BMI) 38.0-38.9, adult: Secondary | ICD-10-CM | POA: Diagnosis not present

## 2023-09-24 DIAGNOSIS — E871 Hypo-osmolality and hyponatremia: Secondary | ICD-10-CM | POA: Diagnosis not present

## 2023-09-24 DIAGNOSIS — R7303 Prediabetes: Secondary | ICD-10-CM | POA: Diagnosis not present

## 2023-10-13 DIAGNOSIS — E871 Hypo-osmolality and hyponatremia: Secondary | ICD-10-CM | POA: Diagnosis not present

## 2023-11-02 ENCOUNTER — Other Ambulatory Visit: Payer: Self-pay

## 2023-11-02 ENCOUNTER — Other Ambulatory Visit (HOSPITAL_COMMUNITY): Payer: Self-pay

## 2023-11-02 MED ORDER — LEVOTHYROXINE SODIUM 50 MCG PO TABS
50.0000 ug | ORAL_TABLET | Freq: Every morning | ORAL | 1 refills | Status: DC
  Filled 2023-11-02: qty 100, 100d supply, fill #0
  Filled 2024-02-08: qty 100, 100d supply, fill #1

## 2023-11-16 ENCOUNTER — Other Ambulatory Visit (HOSPITAL_COMMUNITY): Payer: Self-pay

## 2023-11-16 DIAGNOSIS — D1801 Hemangioma of skin and subcutaneous tissue: Secondary | ICD-10-CM | POA: Diagnosis not present

## 2023-11-16 DIAGNOSIS — L853 Xerosis cutis: Secondary | ICD-10-CM | POA: Diagnosis not present

## 2023-11-16 DIAGNOSIS — L812 Freckles: Secondary | ICD-10-CM | POA: Diagnosis not present

## 2023-11-16 DIAGNOSIS — L218 Other seborrheic dermatitis: Secondary | ICD-10-CM | POA: Diagnosis not present

## 2023-11-16 DIAGNOSIS — D225 Melanocytic nevi of trunk: Secondary | ICD-10-CM | POA: Diagnosis not present

## 2023-11-16 DIAGNOSIS — Z85828 Personal history of other malignant neoplasm of skin: Secondary | ICD-10-CM | POA: Diagnosis not present

## 2023-11-16 MED ORDER — HYDROCORTISONE 2.5 % EX CREA
1.0000 | TOPICAL_CREAM | Freq: Two times a day (BID) | CUTANEOUS | 2 refills | Status: AC
  Filled 2023-11-16: qty 30, 10d supply, fill #0
  Filled 2024-02-08: qty 30, 10d supply, fill #1

## 2023-11-20 ENCOUNTER — Ambulatory Visit: Admitting: Internal Medicine

## 2023-11-30 NOTE — Progress Notes (Unsigned)
 Electrophysiology Office Note:   Date:  12/01/2023  ID:  Janice David, DOB Apr 29, 1941, MRN [id number]  Primary Cardiologist: None Electrophysiologist: Fonda Kitty, MD      History of Present Illness:   Janice David is a 83 y.o. female with h/o permanent atrial fibrillation, chronic diastolic heart failure, hypertension, obesity who is being seen today for follow up evaluation.   Discussed the use of AI scribe software for clinical note transcription with the patient, who gave verbal consent to proceed.  She feels well at home and has not experienced any significant issues with her heart rate. She experienced a fall on July 3rd, hitting her head after tripping over a trash can lid. She felt a 'big knot' on her head but did not seek emergency care. No headaches or other symptoms followed the fall.  She reports a mechanical fall secondary to tripping over a trashcan lid. She does have some balance issues and recognizes the need to be more disciplined about hydration and exercise. She recently worked in her yard for three hours without needing to go inside, indicating some level of physical activity. She is currently taking verapamil  but experienced significant constipation initially with it. She has been cautious with her diet to manage this side effect.  She reports occasional fluid retention and has been prescribed Lasix  to manage this but does not take it.  Review of systems complete and found to be negative unless listed in HPI.   EP Information / Studies Reviewed:    EKG is ordered today. Personal review as below.  EKG Interpretation Date/Time:  Tuesday December 01 2023 10:49:46 EDT Ventricular Rate:  86 PR Interval:    QRS Duration:  84 QT Interval:  354 QTC Calculation: 423 R Axis:   2  Text Interpretation: Atrial fibrillation When compared with ECG of 25-Dec-2020 10:58, No significant change since Confirmed by Kitty Fonda [phone]) on 12/01/2023 11:00:10 AM   Echo 10/2020:   1.  Left ventricular ejection fraction, by estimation, is 50 to 55%. The  left ventricle has low normal function. The left ventricle has no regional  wall motion abnormalities. Left ventricular diastolic parameters are  indeterminate.   2. Right ventricular systolic function is normal. The right ventricular  size is normal. There is normal pulmonary artery systolic pressure. The  estimated right ventricular systolic pressure is 22.0 mmHg.   3. Left atrial size was moderately dilated.   4. Right atrial size was severely dilated.   5. The mitral valve is normal in structure. Mild to moderate mitral valve  regurgitation. No evidence of mitral stenosis.   6. The aortic valve is grossly normal. There is mild calcification of the  aortic valve. Aortic valve regurgitation is not visualized. No aortic  stenosis is present.   7. The inferior vena cava is normal in size with greater than 50%  respiratory variability, suggesting right atrial pressure of 3 mmHg.   Physical Exam:   VS:  BP (!) 148/80 (BP Location: Left Arm, Patient Position: Sitting, Cuff Size: Large)   Pulse 81   Ht 5' 2 (1.575 m)   Wt 209 lb (94.8 kg)   SpO2 98%   BMI 38.23 kg/m    Wt Readings from Last 3 Encounters:  12/01/23 209 lb (94.8 kg)  10/16/22 207 lb (93.9 kg)  10/02/21 212 lb 3.2 oz (96.3 kg)     GEN: Well nourished, well developed in no acute distress NECK: No JVD CARDIAC: Normal rate, irregular rhythm RESPIRATORY:  Clear to auscultation without rales, wheezing or rhonchi  ABDOMEN: Soft, non-distended EXTREMITIES:  No edema; No deformity   ASSESSMENT AND PLAN:    #. Permanent atrial fibrillation: Doing well.  No signs or symptoms of heart failure. -Continue metoprolol  25mg  BID. -Continue verapamil  120mg  daily.   -Continue Xarelto  20mg  daily.   #. Secondary hypercoagulable state due to AF:  #. Recent fall: Almost 1 month ago. Reports this was mechanical. But occasionally has balance issues.  - Fall  precautions provided. Encouraged ED evaluation for any significant fall in the future.  - Continue Eliquis for now. Provided information regarding Watchman device. Would be a reasonable alternative to oral anti-coagulation if patient has another fall.    #. Chronic diastolic heart failure: Well compensated on exam. - Lasix  as needed.  #. Hypertension -Above goal today.  Recommend checking blood pressures 1-2 times per week at home and recording the values.  Recommend bringing these recordings to the primary care physician.  Follow up with EP APP in 12 months  Signed, Fonda Kitty, MD

## 2023-12-01 ENCOUNTER — Other Ambulatory Visit (HOSPITAL_COMMUNITY): Payer: Self-pay

## 2023-12-01 ENCOUNTER — Ambulatory Visit: Attending: Cardiology | Admitting: Cardiology

## 2023-12-01 ENCOUNTER — Other Ambulatory Visit: Payer: Self-pay

## 2023-12-01 VITALS — BP 148/80 | HR 81 | Ht 62.0 in | Wt 209.0 lb

## 2023-12-01 DIAGNOSIS — D6869 Other thrombophilia: Secondary | ICD-10-CM

## 2023-12-01 DIAGNOSIS — I5032 Chronic diastolic (congestive) heart failure: Secondary | ICD-10-CM

## 2023-12-01 DIAGNOSIS — I4821 Permanent atrial fibrillation: Secondary | ICD-10-CM | POA: Diagnosis not present

## 2023-12-01 DIAGNOSIS — I10 Essential (primary) hypertension: Secondary | ICD-10-CM | POA: Diagnosis not present

## 2023-12-01 DIAGNOSIS — W19XXXA Unspecified fall, initial encounter: Secondary | ICD-10-CM | POA: Diagnosis not present

## 2023-12-01 MED ORDER — FUROSEMIDE 20 MG PO TABS
20.0000 mg | ORAL_TABLET | ORAL | 3 refills | Status: AC | PRN
  Filled 2023-12-01: qty 45, 45d supply, fill #0

## 2023-12-01 NOTE — Patient Instructions (Signed)
 Medication Instructions:  Your physician has recommended you make the following change in your medication:  1) CHANGE Lasix  to 20 mg as needed  *If you need a refill on your cardiac medications before your next appointment, please call your pharmacy*  Follow-Up: At [facility], you and your health needs are our priority.  As part of our continuing mission to provide you with exceptional heart care, our providers are all part of one team.  This team includes your primary Cardiologist (physician) and Advanced Practice Providers or APPs (Physician Assistants and Nurse Practitioners) who all work together to provide you with the care you need, when you need it.  Your next appointment:   1 year  Provider:   You will see one of the following Advanced Practice Providers on your designated Care Team:   Charlies Arthur, PA-C Michael Andy Tillery, PA-C Suzann Riddle, NP Daphne Barrack, NP

## 2023-12-02 ENCOUNTER — Other Ambulatory Visit: Payer: Self-pay

## 2023-12-14 ENCOUNTER — Other Ambulatory Visit: Payer: Self-pay | Admitting: Internal Medicine

## 2023-12-16 ENCOUNTER — Other Ambulatory Visit (HOSPITAL_COMMUNITY): Payer: Self-pay

## 2023-12-16 MED ORDER — METOPROLOL TARTRATE 25 MG PO TABS
25.0000 mg | ORAL_TABLET | Freq: Two times a day (BID) | ORAL | 3 refills | Status: DC
  Filled 2023-12-16: qty 180, 90d supply, fill #0

## 2023-12-17 ENCOUNTER — Other Ambulatory Visit (HOSPITAL_COMMUNITY): Payer: Self-pay

## 2023-12-21 ENCOUNTER — Other Ambulatory Visit (HOSPITAL_COMMUNITY): Payer: Self-pay

## 2023-12-23 ENCOUNTER — Ambulatory Visit
Admission: RE | Admit: 2023-12-23 | Discharge: 2023-12-23 | Disposition: A | Discharge status: Home / Self Care | Source: Ambulatory Visit | Attending: Family Medicine | Admitting: Family Medicine

## 2023-12-23 DIAGNOSIS — Z1231 Encounter for screening mammogram for malignant neoplasm of breast: Secondary | ICD-10-CM

## 2024-02-08 ENCOUNTER — Other Ambulatory Visit (HOSPITAL_COMMUNITY): Payer: Self-pay

## 2024-02-24 ENCOUNTER — Other Ambulatory Visit: Payer: Self-pay

## 2024-02-24 ENCOUNTER — Other Ambulatory Visit: Payer: Self-pay | Admitting: Cardiology

## 2024-02-24 ENCOUNTER — Other Ambulatory Visit (HOSPITAL_COMMUNITY): Payer: Self-pay

## 2024-02-24 DIAGNOSIS — I4891 Unspecified atrial fibrillation: Secondary | ICD-10-CM

## 2024-02-24 DIAGNOSIS — I4821 Permanent atrial fibrillation: Secondary | ICD-10-CM

## 2024-02-24 MED ORDER — RIVAROXABAN 20 MG PO TABS
20.0000 mg | ORAL_TABLET | Freq: Every day | ORAL | 1 refills | Status: AC
  Filled 2024-02-24 – 2024-03-01 (×2): qty 100, 100d supply, fill #0

## 2024-02-24 MED ORDER — VERAPAMIL HCL ER 120 MG PO TBCR
120.0000 mg | EXTENDED_RELEASE_TABLET | Freq: Every day | ORAL | 2 refills | Status: DC
Start: 2024-02-24 — End: 2024-03-01
  Filled 2024-02-24 – 2024-03-01 (×2): qty 90, 90d supply, fill #0

## 2024-03-01 ENCOUNTER — Other Ambulatory Visit (HOSPITAL_COMMUNITY): Payer: Self-pay

## 2024-03-01 ENCOUNTER — Other Ambulatory Visit: Payer: Self-pay | Admitting: Cardiology

## 2024-03-01 ENCOUNTER — Other Ambulatory Visit: Payer: Self-pay

## 2024-03-01 DIAGNOSIS — I4821 Permanent atrial fibrillation: Secondary | ICD-10-CM

## 2024-03-02 ENCOUNTER — Other Ambulatory Visit: Payer: Self-pay

## 2024-03-02 ENCOUNTER — Other Ambulatory Visit (HOSPITAL_COMMUNITY): Payer: Self-pay

## 2024-03-02 MED ORDER — METOPROLOL TARTRATE 25 MG PO TABS
25.0000 mg | ORAL_TABLET | Freq: Two times a day (BID) | ORAL | 2 refills | Status: AC
  Filled 2024-03-02: qty 180, 90d supply, fill #0
  Filled 2024-06-01: qty 180, 90d supply, fill #1

## 2024-03-02 MED ORDER — VERAPAMIL HCL ER 120 MG PO TBCR
120.0000 mg | EXTENDED_RELEASE_TABLET | Freq: Every day | ORAL | 2 refills | Status: AC
  Filled 2024-06-01: qty 90, 90d supply, fill #0

## 2024-03-03 ENCOUNTER — Other Ambulatory Visit (HOSPITAL_COMMUNITY): Payer: Self-pay

## 2024-03-16 DIAGNOSIS — I10 Essential (primary) hypertension: Secondary | ICD-10-CM | POA: Diagnosis not present

## 2024-05-17 ENCOUNTER — Other Ambulatory Visit (HOSPITAL_COMMUNITY): Payer: Self-pay

## 2024-05-17 MED ORDER — LEVOTHYROXINE SODIUM 50 MCG PO TABS
50.0000 ug | ORAL_TABLET | Freq: Every morning | ORAL | 1 refills | Status: AC
  Filled 2024-05-17: qty 100, 100d supply, fill #0

## 2024-05-18 ENCOUNTER — Other Ambulatory Visit (HOSPITAL_COMMUNITY): Payer: Self-pay

## 2024-06-01 ENCOUNTER — Other Ambulatory Visit: Payer: Self-pay

## 2024-06-01 ENCOUNTER — Other Ambulatory Visit (HOSPITAL_COMMUNITY): Payer: Self-pay

## 2024-06-02 ENCOUNTER — Other Ambulatory Visit: Payer: Self-pay

## 2024-06-03 ENCOUNTER — Other Ambulatory Visit (HOSPITAL_COMMUNITY): Payer: Self-pay
